# Patient Record
Sex: Female | Born: 1955 | Race: White | Hispanic: No | Smoking: Never smoker
Health system: Southern US, Community
[De-identification: ages and names within clinical notes are randomized; demographics above are authoritative.]

## PROBLEM LIST (undated history)

## (undated) DIAGNOSIS — G4700 Insomnia, unspecified: Secondary | ICD-10-CM

## (undated) DIAGNOSIS — I10 Essential (primary) hypertension: Secondary | ICD-10-CM

## (undated) DIAGNOSIS — F419 Anxiety disorder, unspecified: Secondary | ICD-10-CM

## (undated) DIAGNOSIS — F32A Depression, unspecified: Secondary | ICD-10-CM

## (undated) DIAGNOSIS — E785 Hyperlipidemia, unspecified: Secondary | ICD-10-CM

## (undated) DIAGNOSIS — T7840XA Allergy, unspecified, initial encounter: Secondary | ICD-10-CM

## (undated) HISTORY — DX: Depression, unspecified: F32.A

## (undated) HISTORY — DX: Anxiety disorder, unspecified: F41.9

## (undated) HISTORY — PX: HAND SURGERY: SHX662

## (undated) HISTORY — DX: Allergy, unspecified, initial encounter: T78.40XA

## (undated) HISTORY — DX: Insomnia, unspecified: G47.00

## (undated) HISTORY — DX: Essential (primary) hypertension: I10

## (undated) HISTORY — DX: Hyperlipidemia, unspecified: E78.5

## (undated) HISTORY — PX: ABDOMINAL HYSTERECTOMY: SHX81

---

## 1997-03-23 ENCOUNTER — Other Ambulatory Visit: Admission: RE | Admit: 1997-03-23 | Discharge: 1997-03-23 | Payer: Self-pay | Admitting: Obstetrics and Gynecology

## 1999-06-06 ENCOUNTER — Other Ambulatory Visit: Admission: RE | Admit: 1999-06-06 | Discharge: 1999-06-06 | Payer: Self-pay | Admitting: Obstetrics and Gynecology

## 2000-09-15 ENCOUNTER — Ambulatory Visit (HOSPITAL_COMMUNITY): Admission: RE | Admit: 2000-09-15 | Discharge: 2000-09-15 | Payer: Self-pay | Admitting: Obstetrics and Gynecology

## 2000-09-15 ENCOUNTER — Encounter: Payer: Self-pay | Admitting: Obstetrics and Gynecology

## 2000-09-17 ENCOUNTER — Other Ambulatory Visit: Admission: RE | Admit: 2000-09-17 | Discharge: 2000-09-17 | Payer: Self-pay | Admitting: Obstetrics and Gynecology

## 2000-09-25 ENCOUNTER — Encounter: Payer: Self-pay | Admitting: Obstetrics and Gynecology

## 2000-09-25 ENCOUNTER — Ambulatory Visit (HOSPITAL_COMMUNITY): Admission: RE | Admit: 2000-09-25 | Discharge: 2000-09-25 | Payer: Self-pay | Admitting: Obstetrics and Gynecology

## 2007-02-19 LAB — HM COLONOSCOPY

## 2010-03-08 DIAGNOSIS — I10 Essential (primary) hypertension: Secondary | ICD-10-CM | POA: Insufficient documentation

## 2010-03-08 DIAGNOSIS — M810 Age-related osteoporosis without current pathological fracture: Secondary | ICD-10-CM | POA: Insufficient documentation

## 2016-07-30 LAB — HM DEXA SCAN

## 2017-08-07 DIAGNOSIS — E785 Hyperlipidemia, unspecified: Secondary | ICD-10-CM | POA: Insufficient documentation

## 2017-08-07 DIAGNOSIS — A6000 Herpesviral infection of urogenital system, unspecified: Secondary | ICD-10-CM | POA: Insufficient documentation

## 2017-08-07 DIAGNOSIS — F4381 Prolonged grief disorder: Secondary | ICD-10-CM

## 2017-08-07 DIAGNOSIS — Z90711 Acquired absence of uterus with remaining cervical stump: Secondary | ICD-10-CM | POA: Insufficient documentation

## 2017-08-07 DIAGNOSIS — F419 Anxiety disorder, unspecified: Secondary | ICD-10-CM | POA: Insufficient documentation

## 2017-08-07 DIAGNOSIS — E782 Mixed hyperlipidemia: Secondary | ICD-10-CM | POA: Insufficient documentation

## 2017-08-07 DIAGNOSIS — F4329 Adjustment disorder with other symptoms: Secondary | ICD-10-CM | POA: Insufficient documentation

## 2017-08-07 DIAGNOSIS — J309 Allergic rhinitis, unspecified: Secondary | ICD-10-CM | POA: Insufficient documentation

## 2017-08-07 DIAGNOSIS — E042 Nontoxic multinodular goiter: Secondary | ICD-10-CM | POA: Insufficient documentation

## 2017-08-07 DIAGNOSIS — F4321 Adjustment disorder with depressed mood: Secondary | ICD-10-CM | POA: Insufficient documentation

## 2017-08-07 HISTORY — DX: Prolonged grief disorder: F43.81

## 2017-08-07 HISTORY — DX: Adjustment disorder with other symptoms: F43.29

## 2017-09-11 DIAGNOSIS — H9113 Presbycusis, bilateral: Secondary | ICD-10-CM | POA: Insufficient documentation

## 2018-08-03 DIAGNOSIS — Z0000 Encounter for general adult medical examination without abnormal findings: Secondary | ICD-10-CM | POA: Insufficient documentation

## 2018-08-03 LAB — HM PAP SMEAR: HM Pap smear: NORMAL

## 2018-12-17 ENCOUNTER — Encounter: Payer: Self-pay | Admitting: Internal Medicine

## 2018-12-21 ENCOUNTER — Other Ambulatory Visit: Payer: Self-pay

## 2018-12-21 ENCOUNTER — Encounter: Payer: Self-pay | Admitting: Internal Medicine

## 2018-12-21 ENCOUNTER — Ambulatory Visit (INDEPENDENT_AMBULATORY_CARE_PROVIDER_SITE_OTHER): Payer: Self-pay | Admitting: Internal Medicine

## 2018-12-21 VITALS — BP 128/82 | HR 79 | Temp 97.4°F | Ht 63.0 in | Wt 139.0 lb

## 2018-12-21 DIAGNOSIS — B009 Herpesviral infection, unspecified: Secondary | ICD-10-CM | POA: Insufficient documentation

## 2018-12-21 DIAGNOSIS — Z1231 Encounter for screening mammogram for malignant neoplasm of breast: Secondary | ICD-10-CM

## 2018-12-21 DIAGNOSIS — Z1211 Encounter for screening for malignant neoplasm of colon: Secondary | ICD-10-CM

## 2018-12-21 DIAGNOSIS — Z23 Encounter for immunization: Secondary | ICD-10-CM

## 2018-12-21 DIAGNOSIS — M810 Age-related osteoporosis without current pathological fracture: Secondary | ICD-10-CM

## 2018-12-21 DIAGNOSIS — I10 Essential (primary) hypertension: Secondary | ICD-10-CM

## 2018-12-21 MED ORDER — VALACYCLOVIR HCL 500 MG PO TABS: 1000.0000 mg | ORAL_TABLET | Freq: Two times a day (BID) | ORAL | 0 refills | Status: DC

## 2018-12-21 NOTE — Progress Notes (Signed)
Date:  12/21/2018   Name:  Brittney Morris   DOB:  [birth year]   MRN:  [id number]   Chief Complaint: Establish Care (Moved to [facility] in 1985. In 2007 moved to [facility] and now moved back to [place] this January 2020. Retired in January of this year. ), Questions (Should she take aspirin daily? Shingles vaccine? ), knee lump (X 3 month ago. Left knee lump moves around on knee. Not painful and has gotten smaller in size. ), and ArmPit pain (Has a random pain under left arm pit on and off. No discharge or breast pain otherwise. )  Hypertension This is a chronic problem. The problem is controlled. Pertinent negatives include no chest pain, headaches, palpitations or shortness of breath. Past treatments include beta blockers. The current treatment provides significant improvement.  Insomnia Primary symptoms: sleep disturbance.  The problem occurs nightly. The problem is unchanged. The symptoms are aggravated by anxiety (worse since husband died of cancer). Past treatments include medication (xanax 0.5 at bedtime most nights). The treatment provided significant relief.  Knee Pain  There was no injury mechanism. The pain is present in the left knee (mobile cystic structure lateral knee). The patient is experiencing no pain. The pain has been improving since onset.    Review of Systems  Constitutional: Negative for chills, fatigue, fever and unexpected weight change.  HENT: Negative for trouble swallowing.   Eyes: Negative for visual disturbance.  Respiratory: Negative for cough, chest tightness and shortness of breath.   Cardiovascular: Negative for chest pain, palpitations and leg swelling.  Gastrointestinal: Negative for abdominal pain, blood in stool, constipation and diarrhea.  Genitourinary: Negative for difficulty urinating.  Musculoskeletal: Negative for arthralgias and gait problem.  Skin: Negative for color change and rash.  Neurological: Negative for dizziness and headaches.   Psychiatric/Behavioral: Positive for sleep disturbance. Negative for hallucinations. The patient has insomnia. The patient is not nervous/anxious.     Patient Active Problem List   Diagnosis Date Noted  . Routine history and physical examination of adult 08/03/2018  . Presbycusis of both ears 09/11/2017  . Allergic rhinitis 08/07/2017  . Anxiety disorder 08/07/2017  . Genital herpes simplex 08/07/2017  . History of partial hysterectomy 08/07/2017  . Hyperlipidemia 08/07/2017  . Multiple thyroid nodules 08/07/2017  . Essential hypertension 03/08/2010  . Age-related osteoporosis without current pathological fracture 03/08/2010    Allergies  Allergen Reactions  . Fluconazole Rash  . Sulfa Antibiotics Hives  . Ciprofloxacin Nausea Only  . Lisinopril Cough    Past Surgical History:  Procedure Laterality Date  . ABDOMINAL HYSTERECTOMY     still has cervix and ovaries  . CESAREAN SECTION     X5  . HAND SURGERY      Social History   Tobacco Use  . Smoking status: Never Smoker  . Smokeless tobacco: Never Used  Substance Use Topics  . Alcohol use: Yes    Alcohol/week: 10.0 standard drinks    Types: 10 Standard drinks or equivalent per week  . Drug use: Never     Medication list has been reviewed and updated.  Current Meds  Medication Sig  . ALPRAZolam (XANAX) 0.5 MG tablet Take 0.5 mg by mouth 2 (two) times daily as needed for sleep.   . Calcium 600-400 MG-UNIT CHEW Chew by mouth.  . carvedilol (COREG) 12.5 MG tablet Take 12.5 mg by mouth 2 (two) times daily.  . valACYclovir (VALTREX) 500 MG tablet Take 2 tablets (1,000 mg total) by  mouth 2 (two) times daily.  Marland Kitchen VITAMIN D, CHOLECALCIFEROL, PO Take by mouth.  . [DISCONTINUED] valACYclovir (VALTREX) 1000 MG tablet Take 500 mg by mouth as needed.     PHQ 2/9 Scores 12/21/2018  PHQ - 2 Score 2  PHQ- 9 Score 6    BP Readings from Last 3 Encounters:  12/21/18 128/82    Physical Exam Vitals signs and nursing note  reviewed.  Constitutional:      General: She is not in acute distress.    Appearance: She is well-developed.  HENT:     Head: Normocephalic and atraumatic.  Cardiovascular:     Rate and Rhythm: Normal rate and regular rhythm.     Pulses: Normal pulses.     Heart sounds: No murmur.  Pulmonary:     Effort: Pulmonary effort is normal. No respiratory distress.     Breath sounds: No wheezing or rhonchi.  Abdominal:     General: Abdomen is flat.     Palpations: Abdomen is soft.     Tenderness: There is no abdominal tenderness. There is no right CVA tenderness or guarding.  Musculoskeletal: Normal range of motion.     Right lower leg: No edema.     Left lower leg: No edema.  Lymphadenopathy:     Cervical: No cervical adenopathy.     Upper Body:     Right upper body: No axillary adenopathy.     Left upper body: No axillary adenopathy.  Skin:    General: Skin is warm and dry.     Capillary Refill: Capillary refill takes less than 2 seconds.     Findings: No rash.  Neurological:     General: No focal deficit present.     Mental Status: She is alert and oriented to person, place, and time.  Psychiatric:        Behavior: Behavior normal.        Thought Content: Thought content normal.     Wt Readings from Last 3 Encounters:  12/21/18 139 lb (63 kg)    BP 128/82   Pulse 79   Temp (!) 97.4 F (36.3 C) (Temporal)   Ht 5\' 3"  (1.6 m)   Wt 139 lb (63 kg)   SpO2 99%   BMI 24.62 kg/m   Assessment and Plan: 1. Essential hypertension Clinically stable exam with well controlled BP.   Tolerating medications, coreg bid, without side effects at this time. Pt to continue current regimen and low sodium diet; benefits of regular exercise as able discussed. - CBC with Differential/Platelet - Comprehensive metabolic panel  2. Encounter for screening mammogram for breast cancer Due for mammogram Will schedule with DEXA - MM 3D SCREEN BREAST BILATERAL; Future  3. Colon cancer  screening Due for colonoscopy - Ambulatory referral to Gastroenterology  4. HSV-2 (herpes simplex virus 2) infection Use Valtrex PRN - valACYclovir (VALTREX) 500 MG tablet; Take 2 tablets (1,000 mg total) by mouth 2 (two) times daily.  Dispense: 30 tablet; Refill: 0  5. Age-related osteoporosis without current pathological fracture Continue Calcium and vitamin D Continue weight bearing exercise - DG Bone Density; Future  6. Need for shingles vaccine First dose today - Varicella-zoster vaccine IM   Partially dictated using . Any errors are unintentional.  Animal nutritionist, MD [facility]  12/21/2018

## 2018-12-22 ENCOUNTER — Telehealth: Payer: Self-pay

## 2018-12-22 ENCOUNTER — Other Ambulatory Visit: Payer: Self-pay

## 2018-12-22 DIAGNOSIS — Z1211 Encounter for screening for malignant neoplasm of colon: Secondary | ICD-10-CM

## 2018-12-22 LAB — CBC WITH DIFFERENTIAL/PLATELET
Basophils Absolute: 0.1 10*3/uL (ref 0.0–0.2)
Basos: 1 %
EOS (ABSOLUTE): 0.5 10*3/uL — ABNORMAL HIGH (ref 0.0–0.4)
Eos: 9 %
Hematocrit: 41.3 % (ref 34.0–46.6)
Hemoglobin: 14.2 g/dL (ref 11.1–15.9)
Immature Grans (Abs): 0 10*3/uL (ref 0.0–0.1)
Immature Granulocytes: 0 %
Lymphocytes Absolute: 1.7 10*3/uL (ref 0.7–3.1)
Lymphs: 30 %
MCH: 31.8 pg (ref 26.6–33.0)
MCHC: 34.4 g/dL (ref 31.5–35.7)
MCV: 92 fL (ref 79–97)
Monocytes Absolute: 0.5 10*3/uL (ref 0.1–0.9)
Monocytes: 8 %
Neutrophils Absolute: 2.9 10*3/uL (ref 1.4–7.0)
Neutrophils: 52 %
Platelets: 266 10*3/uL (ref 150–450)
RBC: 4.47 x10E6/uL (ref 3.77–5.28)
RDW: 12.6 % (ref 11.7–15.4)
WBC: 5.7 10*3/uL (ref 3.4–10.8)

## 2018-12-22 LAB — COMPREHENSIVE METABOLIC PANEL
ALT: 12 IU/L (ref 0–32)
AST: 15 IU/L (ref 0–40)
Albumin/Globulin Ratio: 2 (ref 1.2–2.2)
Albumin: 4.5 g/dL (ref 3.8–4.8)
Alkaline Phosphatase: 72 IU/L (ref 39–117)
BUN/Creatinine Ratio: 16 (ref 12–28)
BUN: 17 mg/dL (ref 8–27)
Bilirubin Total: 0.2 mg/dL (ref 0.0–1.2)
CO2: 24 mmol/L (ref 20–29)
Calcium: 9.5 mg/dL (ref 8.7–10.3)
Chloride: 105 mmol/L (ref 96–106)
Creatinine, Ser: 1.05 mg/dL — ABNORMAL HIGH (ref 0.57–1.00)
GFR calc Af Amer: 65 mL/min/{1.73_m2} (ref >59)
GFR calc non Af Amer: 57 mL/min/{1.73_m2} — ABNORMAL LOW (ref >59)
Globulin, Total: 2.2 g/dL (ref 1.5–4.5)
Glucose: 97 mg/dL (ref 65–99)
Potassium: 4.6 mmol/L (ref 3.5–5.2)
Sodium: 142 mmol/L (ref 134–144)
Total Protein: 6.7 g/dL (ref 6.0–8.5)

## 2018-12-22 NOTE — Telephone Encounter (Signed)
Gastroenterology Pre-Procedure Review  Request Date: Monday 01/25/19 Requesting Physician: Dr. Allen Norris  PATIENT REVIEW QUESTIONS: The patient responded to the following health history questions as indicated:    1. Are you having any GI issues? no 2. Do you have a personal history of Polyps? no 3. Do you have a family history of Colon Cancer or Polyps? no 4. Diabetes Mellitus? no 5. Joint replacements in the past 12 months?no 6. Major health problems in the past 3 months?no 7. Any artificial heart valves, MVP, or defibrillator?no    MEDICATIONS & ALLERGIES:    Patient reports the following regarding taking any anticoagulation/antiplatelet therapy:   Plavix, Coumadin, Eliquis, Xarelto, Lovenox, Pradaxa, Brilinta, or Effient? no Aspirin? no  Patient confirms/reports the following medications:  Current Outpatient Medications  Medication Sig Dispense Refill  . ALPRAZolam (XANAX) 0.5 MG tablet Take 0.5 mg by mouth 2 (two) times daily as needed for sleep.     . Calcium 600-400 MG-UNIT CHEW Chew by mouth.    . carvedilol (COREG) 12.5 MG tablet Take 12.5 mg by mouth 2 (two) times daily.    . valACYclovir (VALTREX) 500 MG tablet Take 2 tablets (1,000 mg total) by mouth 2 (two) times daily. 30 tablet 0  . VITAMIN D, CHOLECALCIFEROL, PO Take by mouth.     No current facility-administered medications for this visit.     Patient confirms/reports the following allergies:  Allergies  Allergen Reactions  . Fluconazole Rash  . Sulfa Antibiotics Hives  . Ciprofloxacin Nausea Only  . Lisinopril Cough    No orders of the defined types were placed in this encounter.   AUTHORIZATION INFORMATION Primary Insurance: 1D#: Group #:  Secondary Insurance: 1D#: Group #:  SCHEDULE INFORMATION: Date: 01/25/19 Time: Location:[facility]

## 2019-01-04 ENCOUNTER — Other Ambulatory Visit: Payer: Self-pay

## 2019-01-04 DIAGNOSIS — N644 Mastodynia: Secondary | ICD-10-CM

## 2019-01-05 ENCOUNTER — Other Ambulatory Visit: Payer: Self-pay | Admitting: *Deleted

## 2019-01-05 ENCOUNTER — Inpatient Hospital Stay
Admission: RE | Admit: 2019-01-05 | Discharge: 2019-01-05 | Disposition: A | Discharge status: Home / Self Care | Payer: Self-pay | Source: Ambulatory Visit | Attending: *Deleted | Admitting: *Deleted

## 2019-01-05 DIAGNOSIS — Z1231 Encounter for screening mammogram for malignant neoplasm of breast: Secondary | ICD-10-CM

## 2019-01-20 ENCOUNTER — Other Ambulatory Visit: Payer: Self-pay

## 2019-01-20 ENCOUNTER — Encounter: Payer: Self-pay | Admitting: *Deleted

## 2019-01-21 ENCOUNTER — Other Ambulatory Visit
Admission: RE | Admit: 2019-01-21 | Discharge: 2019-01-21 | Disposition: A | Discharge status: Home / Self Care | Payer: Commercial Managed Care - PPO | Source: Ambulatory Visit | Attending: Gastroenterology | Admitting: Gastroenterology

## 2019-01-21 DIAGNOSIS — Z20828 Contact with and (suspected) exposure to other viral communicable diseases: Secondary | ICD-10-CM | POA: Diagnosis not present

## 2019-01-21 DIAGNOSIS — Z01812 Encounter for preprocedural laboratory examination: Secondary | ICD-10-CM | POA: Diagnosis present

## 2019-01-22 LAB — SARS CORONAVIRUS 2 (TAT 6-24 HRS): SARS Coronavirus 2: NEGATIVE

## 2019-01-22 NOTE — Discharge Instructions (Signed)

## 2019-01-25 ENCOUNTER — Other Ambulatory Visit: Payer: Self-pay

## 2019-01-25 ENCOUNTER — Ambulatory Visit: Payer: Commercial Managed Care - PPO | Admitting: Anesthesiology

## 2019-01-25 ENCOUNTER — Ambulatory Visit
Admission: RE | Admit: 2019-01-25 | Discharge: 2019-01-25 | Disposition: A | Discharge status: Home / Self Care | Payer: Commercial Managed Care - PPO | Attending: Gastroenterology | Admitting: Gastroenterology

## 2019-01-25 ENCOUNTER — Encounter
Admission: RE | Disposition: A | Discharge status: Home / Self Care | Payer: Self-pay | Source: Home / Self Care | Attending: Gastroenterology

## 2019-01-25 DIAGNOSIS — Z820 Family history of epilepsy and other diseases of the nervous system: Secondary | ICD-10-CM | POA: Diagnosis not present

## 2019-01-25 DIAGNOSIS — Z818 Family history of other mental and behavioral disorders: Secondary | ICD-10-CM | POA: Insufficient documentation

## 2019-01-25 DIAGNOSIS — Z8249 Family history of ischemic heart disease and other diseases of the circulatory system: Secondary | ICD-10-CM | POA: Diagnosis not present

## 2019-01-25 DIAGNOSIS — Z1211 Encounter for screening for malignant neoplasm of colon: Secondary | ICD-10-CM | POA: Insufficient documentation

## 2019-01-25 DIAGNOSIS — Z8489 Family history of other specified conditions: Secondary | ICD-10-CM | POA: Insufficient documentation

## 2019-01-25 DIAGNOSIS — F419 Anxiety disorder, unspecified: Secondary | ICD-10-CM | POA: Insufficient documentation

## 2019-01-25 DIAGNOSIS — Z1210 Encounter for screening for malignant neoplasm of intestinal tract, unspecified: Secondary | ICD-10-CM | POA: Diagnosis not present

## 2019-01-25 DIAGNOSIS — K640 First degree hemorrhoids: Secondary | ICD-10-CM | POA: Diagnosis not present

## 2019-01-25 DIAGNOSIS — G4700 Insomnia, unspecified: Secondary | ICD-10-CM | POA: Insufficient documentation

## 2019-01-25 DIAGNOSIS — Z79899 Other long term (current) drug therapy: Secondary | ICD-10-CM | POA: Diagnosis not present

## 2019-01-25 DIAGNOSIS — I10 Essential (primary) hypertension: Secondary | ICD-10-CM | POA: Diagnosis not present

## 2019-01-25 DIAGNOSIS — Z882 Allergy status to sulfonamides status: Secondary | ICD-10-CM | POA: Diagnosis not present

## 2019-01-25 DIAGNOSIS — Z881 Allergy status to other antibiotic agents status: Secondary | ICD-10-CM | POA: Insufficient documentation

## 2019-01-25 DIAGNOSIS — Z888 Allergy status to other drugs, medicaments and biological substances status: Secondary | ICD-10-CM | POA: Diagnosis not present

## 2019-01-25 DIAGNOSIS — Z90710 Acquired absence of both cervix and uterus: Secondary | ICD-10-CM | POA: Insufficient documentation

## 2019-01-25 HISTORY — PX: COLONOSCOPY WITH PROPOFOL: SHX5780

## 2019-01-25 SURGERY — COLONOSCOPY WITH PROPOFOL
Anesthesia: General

## 2019-01-25 MED ORDER — PROPOFOL 10 MG/ML IV BOLUS
INTRAVENOUS | Status: DC | PRN
  Administered 2019-01-25: 40 mg via INTRAVENOUS
  Administered 2019-01-25: 200 mg via INTRAVENOUS

## 2019-01-25 MED ORDER — STERILE WATER FOR IRRIGATION IR SOLN
Status: DC | PRN
  Administered 2019-01-25: 50 mL

## 2019-01-25 MED ORDER — LACTATED RINGERS IV SOLN
100.0000 mL/h | INTRAVENOUS | Status: DC
  Administered 2019-01-25: 09:00:00 via INTRAVENOUS

## 2019-01-25 MED ORDER — LIDOCAINE HCL (CARDIAC) PF 100 MG/5ML IV SOSY
PREFILLED_SYRINGE | INTRAVENOUS | Status: DC | PRN
  Administered 2019-01-25: 30 mg via INTRAVENOUS

## 2019-01-25 SURGICAL SUPPLY — 24 items
CANISTER SUCT 1200ML W/VALVE (MISCELLANEOUS) ×3 IMPLANT
CLIP HMST 235XBRD CATH ROT (MISCELLANEOUS) IMPLANT
CLIP RESOLUTION 360 11X235 (MISCELLANEOUS)
ELECT REM PT RETURN 9FT ADLT (ELECTROSURGICAL)
ELECTRODE REM PT RTRN 9FT ADLT (ELECTROSURGICAL) IMPLANT
FCP ESCP3.2XJMB 240X2.8X (MISCELLANEOUS)
FORCEPS BIOP RAD 4 LRG CAP 4 (CUTTING FORCEPS) IMPLANT
FORCEPS BIOP RJ4 240 W/NDL (MISCELLANEOUS)
FORCEPS ESCP3.2XJMB 240X2.8X (MISCELLANEOUS) IMPLANT
GOWN CVR UNV OPN BCK APRN NK (MISCELLANEOUS) ×2 IMPLANT
GOWN ISOL THUMB LOOP REG UNIV (MISCELLANEOUS) ×6
INJECTOR VARIJECT VIN23 (MISCELLANEOUS) IMPLANT
KIT DEFENDO VALVE AND CONN (KITS) IMPLANT
KIT ENDO PROCEDURE OLY (KITS) ×3 IMPLANT
MARKER SPOT ENDO TATTOO 5ML (MISCELLANEOUS) IMPLANT
PROBE APC STR FIRE (PROBE) IMPLANT
RETRIEVER NET ROTH 2.5X230 LF (MISCELLANEOUS) IMPLANT
SNARE SHORT THROW 13M SML OVAL (MISCELLANEOUS) IMPLANT
SNARE SHORT THROW 30M LRG OVAL (MISCELLANEOUS) IMPLANT
SNARE SNG USE RND 15MM (INSTRUMENTS) IMPLANT
SPOT EX ENDOSCOPIC TATTOO (MISCELLANEOUS)
TRAP ETRAP POLY (MISCELLANEOUS) IMPLANT
VARIJECT INJECTOR VIN23 (MISCELLANEOUS)
WATER STERILE IRR 250ML POUR (IV SOLUTION) ×3 IMPLANT

## 2019-01-25 NOTE — Transfer of Care (Signed)
Immediate Anesthesia Transfer of Care Note  Patient: Brittney Morris  Procedure(s) Performed: COLONOSCOPY WITH PROPOFOL (N/A )  Patient Location: PACU  Anesthesia Type: General  Level of Consciousness: awake, alert  and patient cooperative  Airway and Oxygen Therapy: Patient Spontanous Breathing and Patient connected to supplemental oxygen  Post-op Assessment: Post-op Vital signs reviewed, Patient's Cardiovascular Status Stable, Respiratory Function Stable, Patent Airway and No signs of Nausea or vomiting  Post-op Vital Signs: Reviewed and stable  Complications: No apparent anesthesia complications

## 2019-01-25 NOTE — H&P (Signed)
[name], MD [facility] [address] [place], [place] [address] Phone: [phone] Fax : [phone]  Primary Care Physician:  Glean Hess, MD Primary Gastroenterologist:  Dr. Allen Norris  Pre-Procedure History & Physical: HPI:  Brittney Morris is a 63 y.o. female is here for a screening colonoscopy.   Past Medical History:  Diagnosis Date  . Allergy   . Anxiety   . 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 Hypertension   . Insomnia     Past Surgical History:  Procedure Laterality Date  . ABDOMINAL HYSTERECTOMY     still has cervix and ovaries  . CESAREAN SECTION     X5  . HAND SURGERY      Prior to Admission medications   Medication Sig Start Date End Date Taking? Authorizing Provider  ALPRAZolam Duanne Moron) 0.5 MG tablet Take 0.5 mg by mouth 2 (two) times daily as needed for sleep.  12/04/18  Yes [provider]  Calcium 600-400 MG-UNIT CHEW Chew by mouth. 09/11/17  Yes [provider]  carvedilol (COREG) 12.5 MG tablet Take 12.5 mg by mouth 2 (two) times daily. 11/18/18  Yes [provider]  polyethylene glycol (MIRALAX / GLYCOLAX) 17 g packet Take 17 g by mouth daily as needed.   Yes [provider]  valACYclovir (VALTREX) 500 MG tablet Take 2 tablets (1,000 mg total) by mouth 2 (two) times daily. 12/21/18  Yes Glean Hess, MD  VITAMIN D, CHOLECALCIFEROL, PO Take by mouth.   Yes [provider]    Allergies as of 12/22/2018 - Review Complete 12/21/2018  Allergen Reaction Noted  . Fluconazole Rash 12/18/2009  . Sulfa antibiotics Hives 12/18/2009  . Ciprofloxacin Nausea Only 07/24/2017  . Lisinopril Cough 01/14/2011    Family History  Problem Relation Age of Onset  . Heart disease Mother   . Heart attack Mother   . Muscular dystrophy Father   . Melanoma Daughter     Social History   Socioeconomic History  . Marital status: Widowed    Spouse name: Not on file  . Number of children: 5  .  Years of education: Not on file  . Highest education level: Not on file  Occupational History  . Not on file  Social Needs  . Financial resource strain: Not on file  . Food insecurity    Worry: Not on file    Inability: Not on file  . Transportation needs    Medical: Not on file    Non-medical: Not on file  Tobacco Use  . Smoking status: Never Smoker  . Smokeless tobacco: Never Used  Substance and Sexual Activity  . Alcohol use: Yes    Alcohol/week: 10.0 standard drinks    Types: 10 Standard drinks or equivalent per week  . Drug use: Never  . Sexual activity: Not Currently  Lifestyle  . Physical activity    Days per week: Not on file    Minutes per session: Not on file  . Stress: Not on file  Relationships  . Social Herbalist on phone: Not on file    Gets together: Not on file    Attends religious service: Not on file    Active member of club or organization: Not on file    Attends meetings of clubs or organizations: Not on file    Relationship status: Not on file  . Intimate partner violence    Fear of current or ex partner: Not on file  Emotionally abused: Not on file    Physically abused: Not on file    Forced sexual activity: Not on file  Other Topics Concern  . Not on file  Social History Narrative  . Not on file    Review of Systems: See HPI, otherwise negative ROS  Physical Exam: Ht 5\' 3"  (1.6 m)   Wt 62.6 kg   BMI 24.45 kg/m  General:   Alert,  pleasant and cooperative in NAD Head:  Normocephalic and atraumatic. Neck:  Supple; no masses or thyromegaly. Lungs:  Clear throughout to auscultation.    Heart:  Regular rate and rhythm. Abdomen:  Soft, nontender and nondistended. Normal bowel sounds, without guarding, and without rebound.   Neurologic:  Alert and  oriented x4;  grossly normal neurologically.  Impression/Plan: Brittney Morris is now here to undergo a screening colonoscopy.  Risks, benefits, and alternatives regarding  colonoscopy have been reviewed with the patient.  Questions have been answered.  All parties agreeable.

## 2019-01-25 NOTE — Op Note (Signed)
[facility] Gastroenterology Patient Name: Brittney Morris Procedure Date: 01/25/2019 9:13 AM MRN: [id number] Account #: [id number] Date of Birth: Mar 12, 1955 Admit Type: Outpatient Age: 63 Room: [facility] OR ROOM 01 Gender: Female Note Status: Finalized Procedure:             Colonoscopy Indications:           Screening for colorectal malignant neoplasm Providers:             [name] MD, MD Referring MD:          Halina Maidens, MD (Referring MD) Medicines:             Propofol per Anesthesia Complications:         No immediate complications. Procedure:             Pre-Anesthesia Assessment:                        - Prior to the procedure, a History and Physical was                         performed, and patient medications and allergies were                         reviewed. The patient's tolerance of previous                         anesthesia was also reviewed. The risks and benefits                         of the procedure and the sedation options and risks                         were discussed with the patient. All questions were                         answered, and informed consent was obtained. Prior                         Anticoagulants: The patient has taken no previous                         anticoagulant or antiplatelet agents. ASA Grade                         Assessment: II - A patient with mild systemic disease.                         After reviewing the risks and benefits, the patient                         was deemed in satisfactory condition to undergo the                         procedure.                        After obtaining informed consent, the colonoscope was  passed under direct vision. Throughout the procedure,                         the patient's blood pressure, pulse, and oxygen                         saturations were monitored continuously. The was                         introduced through the anus and  advanced to the the                         cecum, identified by appendiceal orifice and ileocecal                         valve. The colonoscopy was performed without                         difficulty. The patient tolerated the procedure well.                         The quality of the bowel preparation was excellent. Findings:      The perianal and digital rectal examinations were normal.      Non-bleeding internal hemorrhoids were found during retroflexion. The       hemorrhoids were Grade I (internal hemorrhoids that do not prolapse). Impression:            - Non-bleeding internal hemorrhoids.                        - No specimens collected. Recommendation:        - Discharge patient to home.                        - Resume previous diet.                        - Continue present medications. Procedure Code(s):     --- Professional ---                        [phone], Colonoscopy, flexible; diagnostic, including                         collection of specimen(s) by brushing or washing, when                         performed (separate procedure) Diagnosis Code(s):     --- Professional ---                        Z12.11, Encounter for screening for malignant neoplasm                         of colon CPT copyright 2019 American Medical Association. All rights reserved. The codes documented in this report are preliminary and upon coder review may  be revised to meet current compliance requirements. Midge Miniumarren Arwen Haseley MD, MD 01/25/2019 9:33:18 AM This report has been signed electronically. Number of Addenda: 0 Note Initiated On: 01/25/2019 9:13 AM Scope Withdrawal Time: 0 hours 6 minutes  32 seconds  Total Procedure Duration: 0 hours 9 minutes 54 seconds  Estimated Blood Loss:  Estimated blood loss: none.      [facility]

## 2019-01-25 NOTE — Anesthesia Procedure Notes (Signed)
Date/Time: 01/25/2019 9:18 AM Performed by: Cameron Ali, CRNA Pre-anesthesia Checklist: Patient identified, Emergency Drugs available, Suction available, Timeout performed and Patient being monitored Patient Re-evaluated:Patient Re-evaluated prior to induction Oxygen Delivery Method: Nasal cannula Placement Confirmation: positive ETCO2

## 2019-01-25 NOTE — Anesthesia Postprocedure Evaluation (Signed)
Anesthesia Post Note  Patient: Brittney Morris  Procedure(s) Performed: COLONOSCOPY WITH PROPOFOL (N/A )     Patient location during evaluation: PACU Anesthesia Type: General Level of consciousness: awake and alert Pain management: pain level controlled Vital Signs Assessment: post-procedure vital signs reviewed and stable Respiratory status: spontaneous breathing, nonlabored ventilation, respiratory function stable and patient connected to nasal cannula oxygen Cardiovascular status: blood pressure returned to baseline and stable Postop Assessment: no apparent nausea or vomiting Anesthetic complications: no    Adele Barthel Burhan Barham

## 2019-01-25 NOTE — Anesthesia Preprocedure Evaluation (Signed)
Anesthesia Evaluation  Patient identified by MRN, date of birth, ID band Patient awake    History of Anesthesia Complications Negative for: history of anesthetic complications  Airway Mallampati: II  TM Distance: >3 FB Neck ROM: Full    Dental no notable dental hx.    Pulmonary neg pulmonary ROS,    Pulmonary exam normal        Cardiovascular Exercise Tolerance: Good hypertension, Pt. on medications and Pt. on home beta blockers Normal cardiovascular exam     Neuro/Psych PSYCHIATRIC DISORDERS Anxiety negative neurological ROS     GI/Hepatic negative GI ROS, Neg liver ROS,   Endo/Other  negative endocrine ROS  Renal/GU negative Renal ROS     Musculoskeletal negative musculoskeletal ROS (+)   Abdominal   Peds  Hematology negative hematology ROS (+)   Anesthesia Other Findings   Reproductive/Obstetrics                             Anesthesia Physical Anesthesia Plan  ASA: II  Anesthesia Plan: General   Post-op Pain Management:    Induction: Intravenous  PONV Risk Score and Plan: 2 and Propofol infusion and TIVA  Airway Management Planned: Nasal Cannula and Natural Airway  Additional Equipment: None  Intra-op Plan:   Post-operative Plan:   Informed Consent: I have reviewed the patients History and Physical, chart, labs and discussed the procedure including the risks, benefits and alternatives for the proposed anesthesia with the patient or authorized representative who has indicated his/her understanding and acceptance.       Plan Discussed with: CRNA  Anesthesia Plan Comments:         Anesthesia Quick Evaluation

## 2019-01-26 ENCOUNTER — Encounter: Payer: Self-pay | Admitting: Gastroenterology

## 2019-01-28 ENCOUNTER — Ambulatory Visit
Admission: RE | Admit: 2019-01-28 | Discharge: 2019-01-28 | Disposition: A | Discharge status: Home / Self Care | Payer: Commercial Managed Care - PPO | Source: Ambulatory Visit | Attending: Internal Medicine | Admitting: Internal Medicine

## 2019-01-28 DIAGNOSIS — M810 Age-related osteoporosis without current pathological fracture: Secondary | ICD-10-CM | POA: Insufficient documentation

## 2019-01-28 DIAGNOSIS — N644 Mastodynia: Secondary | ICD-10-CM

## 2019-03-19 ENCOUNTER — Other Ambulatory Visit: Payer: Self-pay | Admitting: Internal Medicine

## 2019-03-19 ENCOUNTER — Telehealth: Payer: Self-pay

## 2019-03-19 DIAGNOSIS — Z23 Encounter for immunization: Secondary | ICD-10-CM

## 2019-03-19 MED ORDER — SHINGRIX 50 MCG/0.5ML IM SUSR: 0.5000 mL | Freq: Once | INTRAMUSCULAR | 0 refills | Status: AC

## 2019-03-19 NOTE — Telephone Encounter (Signed)
Patient called saying she has now moved to [facility]. She is scheduled for her 2nd Shingrix vaccine soon.   Wants a Rx faxed to her pharmacy in [place] so she can get her 2nd dose where she is located.  Need Rx printed and i'll fax for pt.   Thank you.

## 2019-03-19 NOTE — Telephone Encounter (Signed)
She may change. If she does, she will call to cancel her next appt. If not, she will come to her appt in May.

## 2019-03-19 NOTE — Telephone Encounter (Signed)
Is she also changing PCP?

## 2019-03-23 ENCOUNTER — Ambulatory Visit: Payer: Self-pay

## 2019-05-12 ENCOUNTER — Telehealth: Payer: Self-pay

## 2019-05-12 NOTE — Telephone Encounter (Signed)
Patient canceled her appt in February. She is supposed to get her 2nd shingles shot before 06/22/19.   Please call the patient and schedule appt for nurse visit before 06/22/19.  Thank you.

## 2019-05-12 NOTE — Telephone Encounter (Signed)
Thank you. Removed Dr B as the primary since pt moved. Added 2nd shingles shot.  CM

## 2019-05-12 NOTE — Telephone Encounter (Signed)
Pt got her 2nd [place] [facility] [place]

## 2019-06-20 ENCOUNTER — Other Ambulatory Visit: Payer: Self-pay | Admitting: Internal Medicine

## 2019-06-22 ENCOUNTER — Encounter: Payer: Self-pay | Admitting: Internal Medicine

## 2020-07-04 IMAGING — US US BREAST*L* LIMITED INC AXILLA
1 series · 2 of 2 positions shown · non-contrast
Comparison: Previous exam(s).

CLINICAL DATA: 63-year-old patient with focal area of tenderness in
the far lateral left breast intermittently. She does not palpate a
lump in either breast.

EXAM:
DIGITAL DIAGNOSTIC BILATERAL MAMMOGRAM WITH CAD AND TOMO
ULTRASOUND LEFT BREAST

[Series 1: us breast*left* limited inc axilla · 0.05mm/px · 2 of 2 slices shown]
[im 1/2]
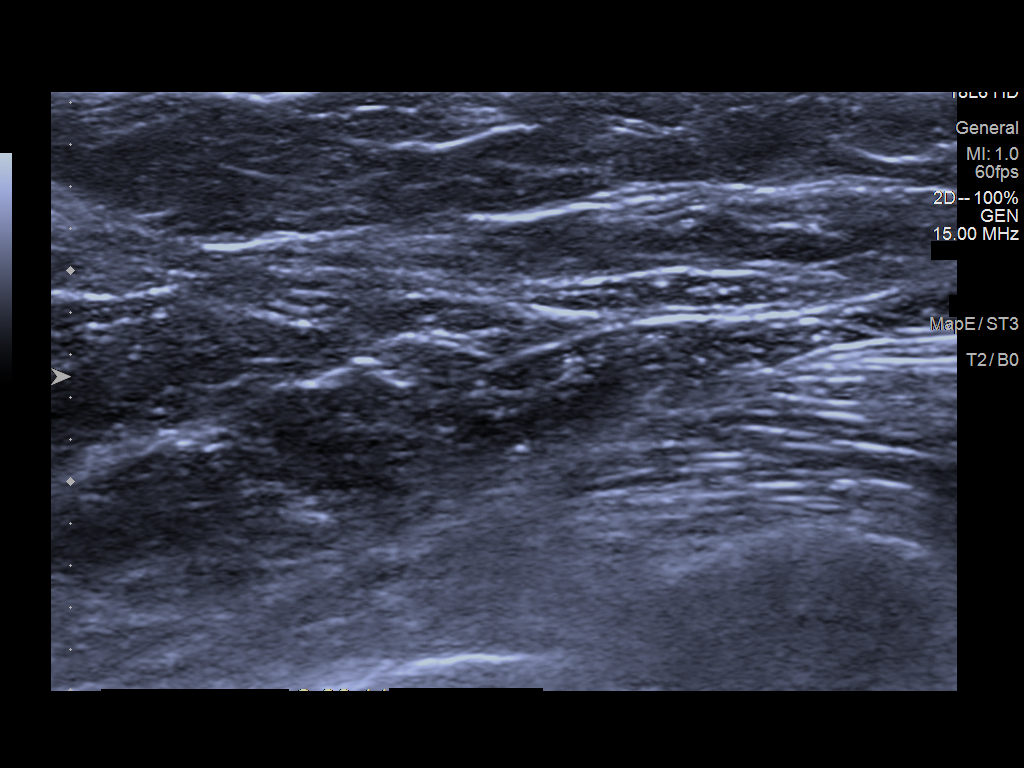
[im 2/2]
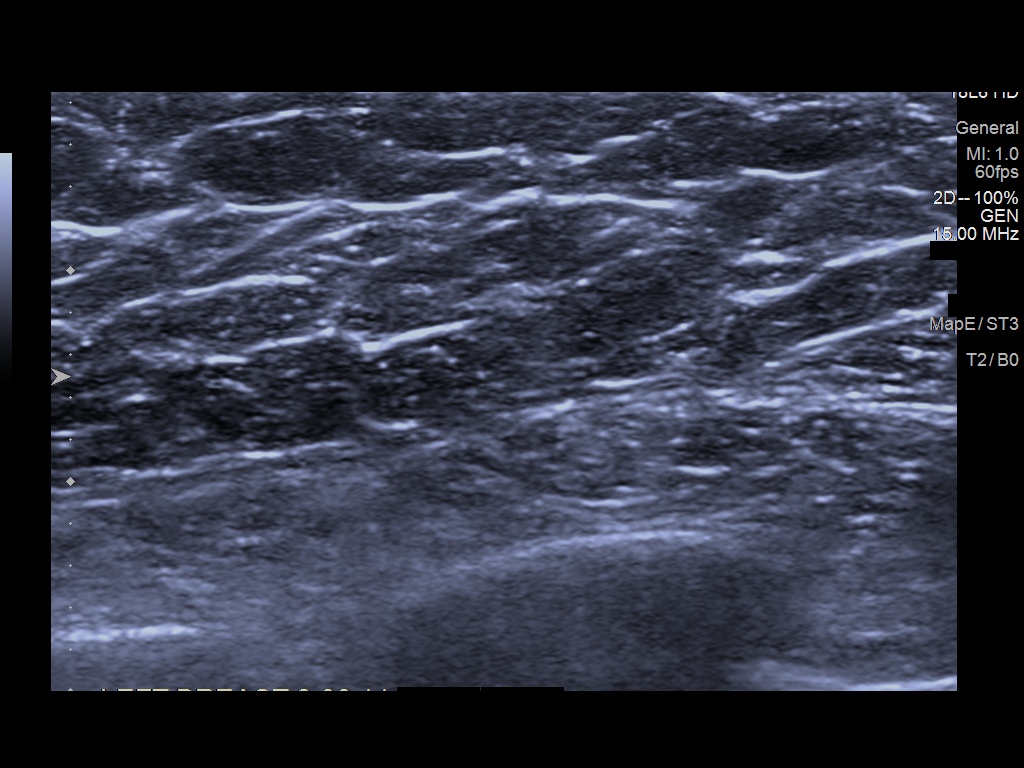

[2 of 2 positions shown; findings below may reference images not displayed]

ACR Breast Density Category b: There are scattered areas of
fibroglandular density.
FINDINGS: No mass, architectural distortion, or suspicious microcalcification
is identified to suggest malignancy in either breast.

Mammographic images were processed with CAD.

Targeted ultrasound is performed, showing normal fatty breast tissue
in the 3 o'clock position of the left breast approximately 11 cm
from the nipple in the region of patient tenderness. Normal skin
thickness. No suspicious findings.
IMPRESSION: No evidence of malignancy in either breast.

RECOMMENDATION:
Screening mammogram in one year.(Code:IV-M-V8M)

I have discussed the findings and recommendations with the patient.
If applicable, a reminder letter will be sent to the patient
regarding the next appointment.

BI-RADS CATEGORY  1: Negative.

## 2020-07-04 IMAGING — MG DIGITAL DIAGNOSTIC BILAT W/ TOMO W/ CAD
6 of 10 series · 6 of 30 positions shown · non-contrast
Comparison: Previous exam(s).

CLINICAL DATA: 63-year-old patient with focal area of tenderness in
the far lateral left breast intermittently. She does not palpate a
lump in either breast.

EXAM:
DIGITAL DIAGNOSTIC BILATERAL MAMMOGRAM WITH CAD AND TOMO
ULTRASOUND LEFT BREAST

[L MLO synth-2D]
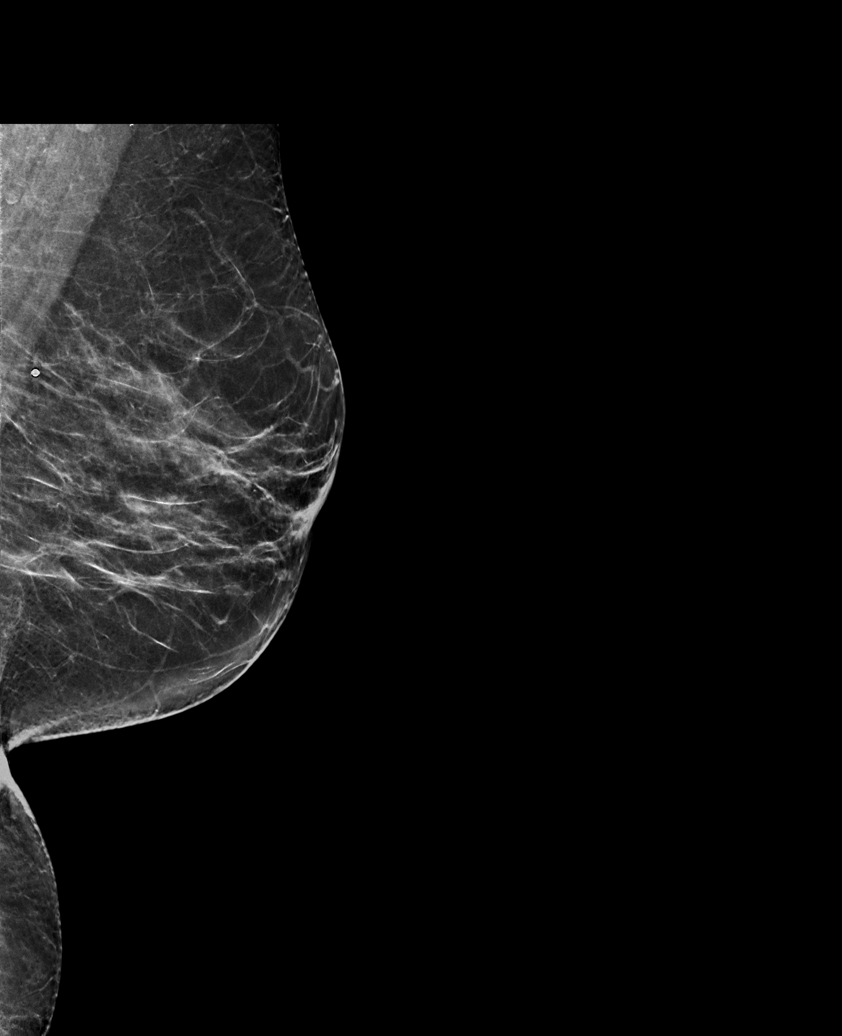

[R MLO synth-2D]
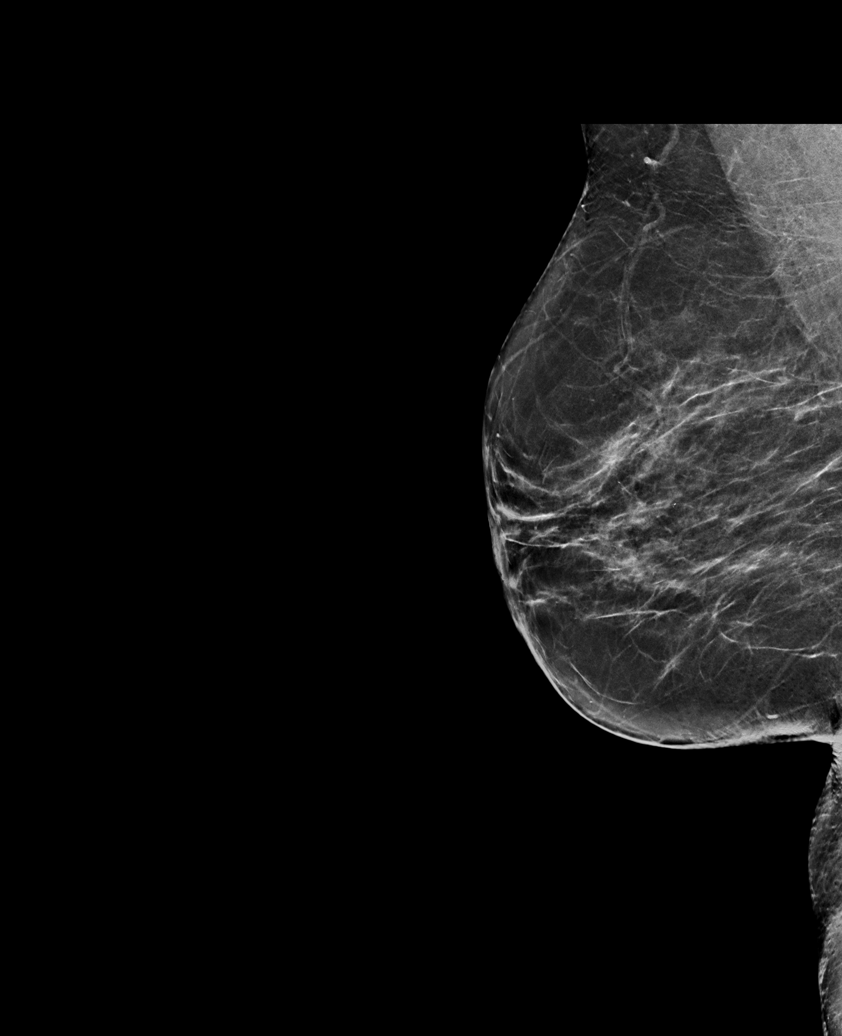

[L CC synth-2D]
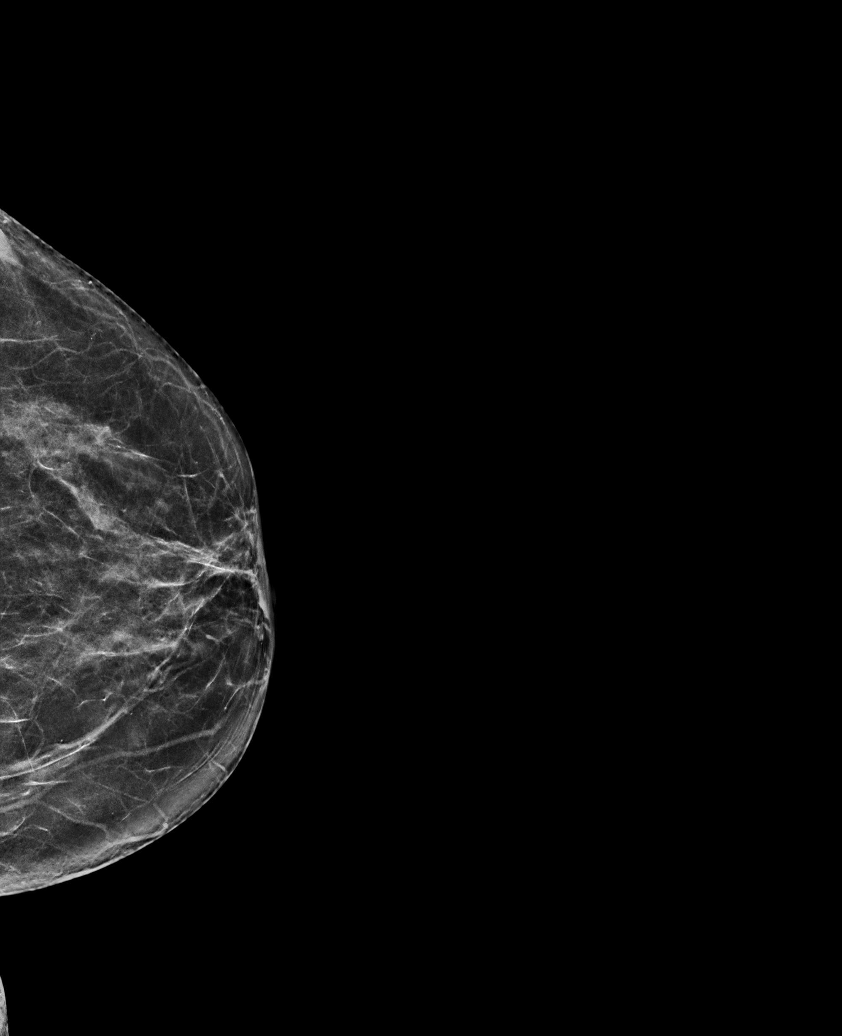

[L TAN synth-2D]
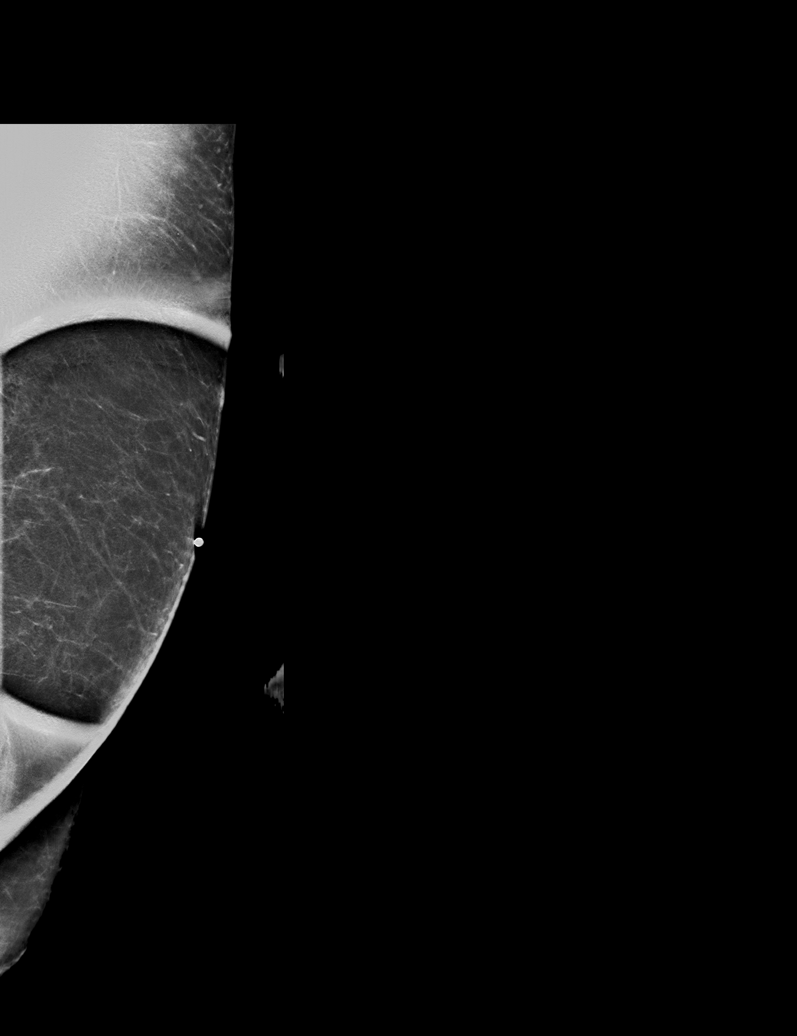

[R CC synth-2D]
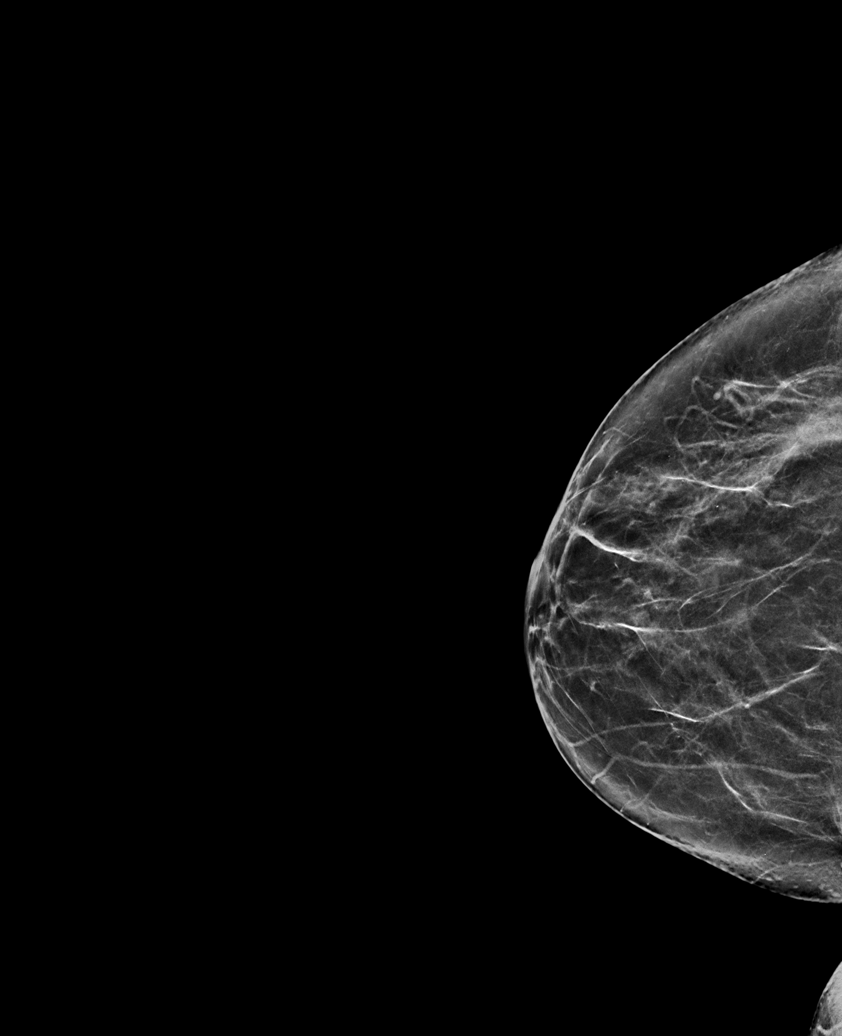

[L TAN tomo · tomo slice 29/57.0]
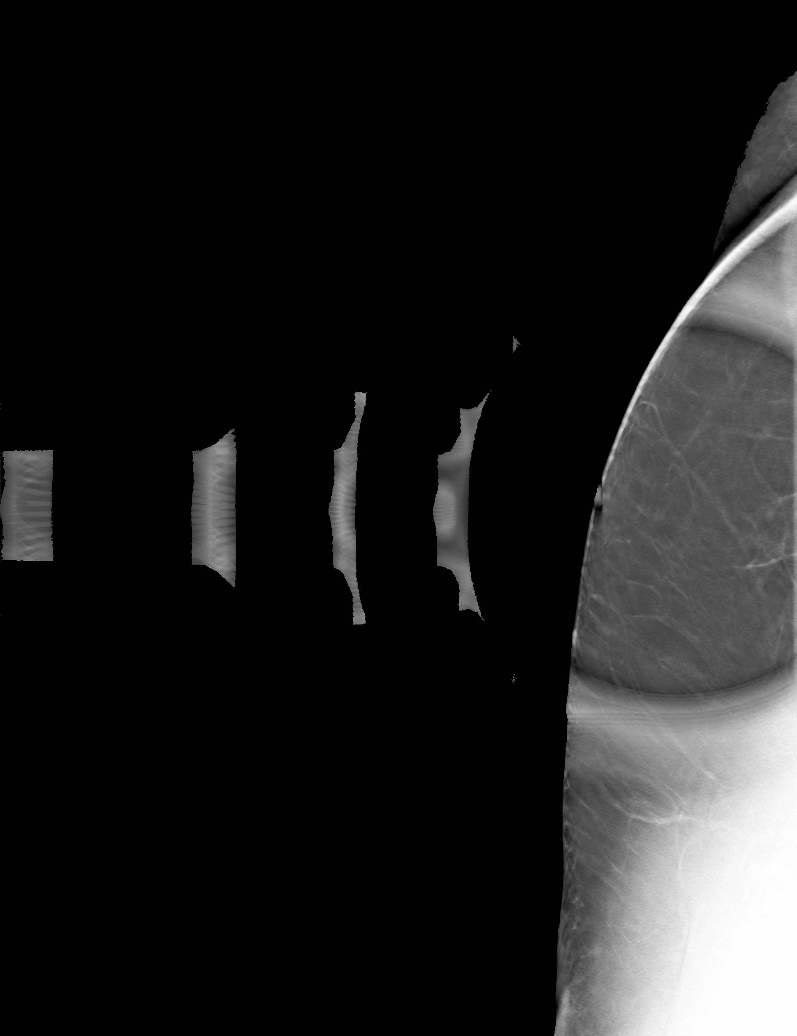

[6 of 30 positions shown; findings below may reference images not displayed]

ACR Breast Density Category b: There are scattered areas of
fibroglandular density.
FINDINGS: No mass, architectural distortion, or suspicious microcalcification
is identified to suggest malignancy in either breast.

Mammographic images were processed with CAD.

Targeted ultrasound is performed, showing normal fatty breast tissue
in the 3 o'clock position of the left breast approximately 11 cm
from the nipple in the region of patient tenderness. Normal skin
thickness. No suspicious findings.
IMPRESSION: No evidence of malignancy in either breast.

RECOMMENDATION:
Screening mammogram in one year.(Code:IV-M-V8M)

I have discussed the findings and recommendations with the patient.
If applicable, a reminder letter will be sent to the patient
regarding the next appointment.

BI-RADS CATEGORY  1: Negative.

## 2021-04-10 ENCOUNTER — Encounter: Payer: Medicare PPO | Admitting: Internal Medicine

## 2021-05-03 ENCOUNTER — Ambulatory Visit
Admission: EM | Admit: 2021-05-03 | Discharge: 2021-05-03 | Disposition: A | Discharge status: Home / Self Care | Payer: Medicare PPO | Attending: Emergency Medicine | Admitting: Emergency Medicine

## 2021-05-03 ENCOUNTER — Other Ambulatory Visit: Payer: Self-pay

## 2021-05-03 DIAGNOSIS — N390 Urinary tract infection, site not specified: Secondary | ICD-10-CM | POA: Diagnosis present

## 2021-05-03 DIAGNOSIS — J069 Acute upper respiratory infection, unspecified: Secondary | ICD-10-CM | POA: Diagnosis present

## 2021-05-03 LAB — URINALYSIS, ROUTINE W REFLEX MICROSCOPIC
Bilirubin Urine: NEGATIVE
Glucose, UA: NEGATIVE mg/dL
Ketones, ur: NEGATIVE mg/dL
Nitrite: NEGATIVE
Specific Gravity, Urine: <1.005 — ABNORMAL LOW (ref 1.005–1.030)
pH: 6 (ref 5.0–8.0)

## 2021-05-03 LAB — URINALYSIS, MICROSCOPIC (REFLEX): WBC, UA: >50 WBC/hpf (ref 0–5)

## 2021-05-03 LAB — RAPID INFLUENZA A&B ANTIGENS
Influenza A (ARMC): NEGATIVE
Influenza B (ARMC): NEGATIVE

## 2021-05-03 MED ORDER — BENZONATATE 100 MG PO CAPS: 200.0000 mg | ORAL_CAPSULE | Freq: Three times a day (TID) | ORAL | 0 refills | Status: DC

## 2021-05-03 MED ORDER — PHENAZOPYRIDINE HCL 200 MG PO TABS: 200.0000 mg | ORAL_TABLET | Freq: Three times a day (TID) | ORAL | 0 refills | Status: DC

## 2021-05-03 MED ORDER — PROMETHAZINE-DM 6.25-15 MG/5ML PO SYRP: 5.0000 mL | ORAL_SOLUTION | Freq: Four times a day (QID) | ORAL | 0 refills | Status: DC | PRN

## 2021-05-03 MED ORDER — CEPHALEXIN 500 MG PO CAPS: 500.0000 mg | ORAL_CAPSULE | Freq: Two times a day (BID) | ORAL | 0 refills | Status: AC

## 2021-05-03 MED ORDER — IPRATROPIUM BROMIDE 0.06 % NA SOLN: 2.0000 | Freq: Four times a day (QID) | NASAL | 12 refills | Status: DC

## 2021-05-03 NOTE — Discharge Instructions (Addendum)
Take the Keflex twice daily for 7 days with food for treatment of urinary tract infection. ° °Use the Pyridium every 8 hours as needed for urinary discomfort.  This will turn your urine a bright red-orange. ° °Increase your oral fluid intake so that you increase your urine production and or flushing your urinary system. ° °Take an over-the-counter probiotic, such as Culturelle-Align-Activia, 1 hour after each dose of antibiotic to prevent diarrhea or yeast infections from forming. ° °We will culture urine and change the antibiotics if necessary. ° °Use the Atrovent nasal spray, 2 squirts in each nostril every 6 hours, as needed for runny nose and postnasal drip. ° °Use the Tessalon Perles every 8 hours during the day.  Take them with a small sip of water.  They may give you some numbness to the base of your tongue or a metallic taste in your mouth, this is normal. ° °Use the Promethazine DM cough syrup at bedtime for cough and congestion.  It will make you drowsy so do not take it during the day. ° °Return for reevaluation, or see your primary care provider, for any new or worsening symptoms.  °

## 2021-05-03 NOTE — ED Triage Notes (Signed)
Patient presents to Urgent Care with complaints of chills, fever, cough since yesterday. Treating symptoms with cough drops, advil, robitussin, and Tylenol. Took tylenol at 0940. Tested positive for covid 02/15.  ?

## 2021-05-03 NOTE — ED Provider Notes (Signed)
?[facility] ? ? ? ?CSN: [id number] ?Arrival date & time: 05/03/21  1036 ? ? ?  ? ?History   ?Chief Complaint ?Chief Complaint  ?Patient presents with  ? Otalgia  ? Nasal Congestion  ? Dysuria  ? ? ?HPI ?Brittney Morris is a 66 y.o. female.  ? ?HPI ? ?Patient is a very pleasant, nontoxic-appearing 66 year old female here for evaluation of respiratory complaints. ? ?Patient reports that since yesterday she has been experiencing chills, fever with a Tmax of 100.1, and a nonproductive cough.  The cough increases when she has been talking.  She also has had some runny nose and nasal congestion and some head pressure.  Patient did have COVID back in February but those symptoms have completely resolved.  She denies any ear pain, shortness breath, or wheezing.  In addition, patient has been experiencing painful urination with urinary frequency for the past 2 days.  This is not associated with urgency of urination, blood in her urine, or low back pain.  Patient denies any vaginal discharge or itching. ? ?Past Medical History:  ?Diagnosis Date  ? Allergy   ? Anxiety   ? 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 Hypertension   ? Insomnia   ? ? ?Patient Active Problem List  ? Diagnosis Date Noted  ? Special screening for malignant neoplasm of intestine   ? 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 Routine history and physical examination of adult 08/03/2018  ? Presbycusis of both ears 09/11/2017  ? Allergic rhinitis 08/07/2017  ? Anxiety disorder 08/07/2017  ? Genital herpes simplex 08/07/2017  ? History of partial hysterectomy 08/07/2017  ? Hyperlipidemia 08/07/2017  ? Multiple thyroid nodules 08/07/2017  ? Essential hypertension 03/08/2010  ? Age-related osteoporosis without current pathological fracture 03/08/2010  ? ? ?Past Surgical History:  ?Procedure Laterality Date  ? ABDOMINAL HYSTERECTOMY    ? still has cervix and ovaries  ? CESAREAN SECTION    ? X5  ? COLONOSCOPY WITH PROPOFOL N/A  01/25/2019  ? Procedure: COLONOSCOPY WITH PROPOFOL;  Surgeon: [name], MD;  Location: [facility];  Service: Endoscopy;  Laterality: N/A;  ? HAND SURGERY    ? ? ?OB History   ?No obstetric history on file. ?  ? ? ? ?Home Medications   ? ?Prior to Admission medications   ?Medication Sig Start Date End Date Taking? Authorizing Provider  ?benzonatate (TESSALON) 100 MG capsule Take 2 capsules (200 mg total) by mouth every 8 (eight) hours. 05/03/21  Yes Margarette Canada, NP  ?cephALEXin (KEFLEX) 500 MG capsule Take 1 capsule (500 mg total) by mouth 2 (two) times daily for 7 days. 05/03/21 05/10/21 Yes Margarette Canada, NP  ?ipratropium (ATROVENT) 0.06 % nasal spray Place 2 sprays into both nostrils 4 (four) times daily. 05/03/21  Yes Margarette Canada, NP  ?phenazopyridine (PYRIDIUM) 200 MG tablet Take 1 tablet (200 mg total) by mouth 3 (three) times daily. 05/03/21  Yes Margarette Canada, NP  ?promethazine-dextromethorphan (PROMETHAZINE-DM) 6.25-15 MG/5ML syrup Take 5 mLs by mouth 4 (four) times daily as needed. 05/03/21  Yes Margarette Canada, NP  ?valACYclovir (VALTREX) 500 MG tablet Take by mouth. 02/15/21  Yes [provider]  ?ALPRAZolam Duanne Moron) 0.5 MG tablet Take 0.5 mg by mouth 2 (two) times daily as needed for sleep.  12/04/18   [provider]  ?Calcium 600-400 MG-UNIT CHEW Chew by mouth. 09/11/17   [provider]  ?carvedilol (COREG) 12.5 MG tablet Take 12.5 mg by mouth  2 (two) times daily. 11/18/18   [provider]  ?conjugated estrogens (PREMARIN) vaginal cream Premarin 0.625 mg/gram vaginal cream    [provider]  ?polyethylene glycol (MIRALAX / GLYCOLAX) 17 g packet Take 17 g by mouth daily as needed.    [provider]  ?valACYclovir (VALTREX) 500 MG tablet Take 2 tablets (1,000 mg total) by mouth 2 (two) times daily. 12/21/18   Reubin Milan, MD  ?vitamin B-12 (CYANOCOBALAMIN) 250 MCG tablet Take by mouth.    [provider]  ?VITAMIN D, CHOLECALCIFEROL,  PO Take by mouth.    [provider]  ? ? ?Family History ?Family History  ?Problem Relation Age of Onset  ? Heart disease Mother   ? Heart attack Mother   ? Muscular dystrophy Father   ? Melanoma Daughter   ? ? ?Social History ?Social History  ? ?Tobacco Use  ? Smoking status: Never  ? Smokeless tobacco: Never  ?Vaping Use  ? Vaping Use: Never used  ?Substance Use Topics  ? Alcohol use: Not Currently  ?  Alcohol/week: 10.0 standard drinks  ?  Types: 10 Standard drinks or equivalent per week  ? Drug use: Never  ? ? ? ?Allergies   ?Vancomycin, Fluconazole, Sulfa antibiotics, Ciprofloxacin, and Lisinopril ? ? ?Review of Systems ?Review of Systems  ?Constitutional:  Positive for chills and fever.  ?HENT:  Positive for congestion and rhinorrhea. Negative for ear pain and sore throat.   ?Respiratory:  Positive for cough. Negative for shortness of breath and wheezing.   ?Genitourinary:  Positive for dysuria and frequency. Negative for hematuria, urgency, vaginal discharge and vaginal pain.  ?Musculoskeletal:  Negative for back pain.  ?Skin:  Negative for rash.  ?Hematological: Negative.   ?Psychiatric/Behavioral: Negative.    ? ? ?Physical Exam ?Triage Vital Signs ?ED Triage Vitals  ?Enc Vitals Group  ?   BP 05/03/21 1147 129/78  ?   Pulse Rate 05/03/21 1147 79  ?   Resp --   ?   Temp 05/03/21 1147 99.2 ?F (37.3 ?C)  ?   Temp Source 05/03/21 1147 Oral  ?   SpO2 05/03/21 1147 96 %  ?   Weight --   ?   Height --   ?   Head Circumference --   ?   Peak Flow --   ?   Pain Score 05/03/21 1153 0  ?   Pain Loc --   ?   Pain Edu? --   ?   Excl. in GC? --   ? ?No data found. ? ?Updated Vital Signs ?BP 129/78 (BP Location: Right Arm)   Pulse 79   Temp 99.2 ?F (37.3 ?C) (Oral)   SpO2 96%  ? ?Visual Acuity ?Right Eye Distance:   ?Left Eye Distance:   ?Bilateral Distance:   ? ?Right Eye Near:   ?Left Eye Near:    ?Bilateral Near:    ? ?Physical Exam ?Vitals and nursing note reviewed.  ?Constitutional:   ?   Appearance:  Normal appearance. She is not ill-appearing.  ?HENT:  ?   Head: Normocephalic and atraumatic.  ?   Right Ear: Tympanic membrane, ear canal and external ear normal. There is no impacted cerumen.  ?   Left Ear: Tympanic membrane, ear canal and external ear normal. There is no impacted cerumen.  ?   Nose: Congestion and rhinorrhea present.  ?   Mouth/Throat:  ?   Mouth: Mucous membranes are moist.  ?  Pharynx: Oropharynx is clear. Posterior oropharyngeal erythema present.  ?Cardiovascular:  ?   Rate and Rhythm: Normal rate and regular rhythm.  ?   Pulses: Normal pulses.  ?   Heart sounds: Normal heart sounds. No murmur heard. ?  No friction rub. No gallop.  ?Pulmonary:  ?   Effort: Pulmonary effort is normal.  ?   Breath sounds: Normal breath sounds. No wheezing, rhonchi or rales.  ?Abdominal:  ?   General: Abdomen is flat.  ?   Palpations: Abdomen is soft.  ?   Tenderness: There is no abdominal tenderness. There is no right CVA tenderness or left CVA tenderness.  ?Musculoskeletal:  ?   Cervical back: Normal range of motion and neck supple.  ?Lymphadenopathy:  ?   Cervical: No cervical adenopathy.  ?Skin: ?   General: Skin is warm and dry.  ?   Capillary Refill: Capillary refill takes less than 2 seconds.  ?   Findings: No erythema or rash.  ?Neurological:  ?   General: No focal deficit present.  ?   Mental Status: She is alert and oriented to person, place, and time.  ?Psychiatric:     ?   Mood and Affect: Mood normal.     ?   Behavior: Behavior normal.     ?   Thought Content: Thought content normal.     ?   Judgment: Judgment normal.  ? ? ? ?UC Treatments / Results  ?Labs ?(all labs ordered are listed, but only abnormal results are displayed) ?Labs Reviewed  ?URINALYSIS, ROUTINE W REFLEX MICROSCOPIC - Abnormal; Notable for the following components:  ?    Result Value  ? APPearance HAZY (*)   ? Specific Gravity, Urine <1.005 (*)   ? Hgb urine dipstick TRACE (*)   ? Protein, ur TRACE (*)   ? Leukocytes,Ua MODERATE  (*)   ? All other components within normal limits  ?URINALYSIS, MICROSCOPIC (REFLEX) - Abnormal; Notable for the following components:  ? Bacteria, UA MANY (*)   ? All other components within normal limits

## 2021-05-03 NOTE — ED Triage Notes (Signed)
Patient presents to Urgent Care with complaints of dysuria since Tuesday. Concerned with UTI.  ?

## 2021-05-06 LAB — URINE CULTURE: Culture: >=100000 — AB

## 2021-06-11 ENCOUNTER — Ambulatory Visit
Admission: EM | Admit: 2021-06-11 | Discharge: 2021-06-11 | Disposition: A | Discharge status: Home / Self Care | Payer: Medicare PPO | Attending: Internal Medicine | Admitting: Internal Medicine

## 2021-06-11 DIAGNOSIS — J40 Bronchitis, not specified as acute or chronic: Secondary | ICD-10-CM | POA: Diagnosis not present

## 2021-06-11 DIAGNOSIS — J040 Acute laryngitis: Secondary | ICD-10-CM | POA: Diagnosis not present

## 2021-06-11 DIAGNOSIS — J0110 Acute frontal sinusitis, unspecified: Secondary | ICD-10-CM | POA: Diagnosis not present

## 2021-06-11 MED ORDER — CLARITHROMYCIN ER 500 MG PO TB24: 1000.0000 mg | ORAL_TABLET | Freq: Every day | ORAL | 0 refills | Status: DC

## 2021-06-11 MED ORDER — HYDROCODONE BIT-HOMATROP MBR 5-1.5 MG/5ML PO SOLN: 5.0000 mL | Freq: Every evening | ORAL | 0 refills | Status: DC | PRN

## 2021-06-11 NOTE — ED Triage Notes (Signed)
Pt c/o Cough with Green/brown phlegm, nasal congestion, nasal drainage x2weeks ?

## 2021-06-11 NOTE — ED Provider Notes (Signed)
?[facility] ? ? ? ?CSN: [id number] ?Arrival date & time: 06/11/21  1448 ? ? ?  ? ?History   ?Chief Complaint ?Chief Complaint  ?Patient presents with  ? Cough  ? Nasal Congestion  ? ? ?HPI ?Brittney Morris is a 66 y.o. female who presents with URI symptoms starting 2 weeks ago, now is coughing green mucous. It all seemed to start with what she thought it was allergies.  ? ? ? ?Past Medical History:  ?Diagnosis Date  ? Allergy   ? Anxiety   ? 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 Hypertension   ? Insomnia   ? ? ?Patient Active Problem List  ? Diagnosis Date Noted  ? Special screening for malignant neoplasm of intestine   ? 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 Routine history and physical examination of adult 08/03/2018  ? Presbycusis of both ears 09/11/2017  ? Allergic rhinitis 08/07/2017  ? Anxiety disorder 08/07/2017  ? Genital herpes simplex 08/07/2017  ? History of partial hysterectomy 08/07/2017  ? Hyperlipidemia 08/07/2017  ? Multiple thyroid nodules 08/07/2017  ? Essential hypertension 03/08/2010  ? Age-related osteoporosis without current pathological fracture 03/08/2010  ? ? ?Past Surgical History:  ?Procedure Laterality Date  ? ABDOMINAL HYSTERECTOMY    ? still has cervix and ovaries  ? CESAREAN SECTION    ? X5  ? COLONOSCOPY WITH PROPOFOL N/A 01/25/2019  ? Procedure: COLONOSCOPY WITH PROPOFOL;  Surgeon: Midge Minium, MD;  Location: [facility];  Service: Endoscopy;  Laterality: N/A;  ? HAND SURGERY    ? ? ?OB History   ?No obstetric history on file. ?  ? ? ? ?Home Medications   ? ?Prior to Admission medications   ?Medication Sig Start Date End Date Taking? Authorizing Provider  ?ALPRAZolam (XANAX) 0.5 MG tablet Take 0.5 mg by mouth 2 (two) times daily as needed for sleep.  12/04/18  Yes [provider]  ?Calcium 600-400 MG-UNIT CHEW Chew by mouth. 09/11/17  Yes [provider]  ?carvedilol (COREG) 12.5 MG tablet Take 12.5 mg by mouth 2  (two) times daily. 11/18/18  Yes [provider]  ?clarithromycin (BIAXIN XL) 500 MG 24 hr tablet Take 2 tablets (1,000 mg total) by mouth daily. 06/11/21  Yes Rodriguez-Southworth, Nettie Elm, PA-C  ?HYDROcodone bit-homatropine (HYCODAN) 5-1.5 MG/5ML syrup Take 5 mLs by mouth at bedtime as needed for cough. 06/11/21  Yes Rodriguez-Southworth, Nettie Elm, PA-C  ?ipratropium (ATROVENT) 0.06 % nasal spray Place 2 sprays into both nostrils 4 (four) times daily. 05/03/21  Yes Becky Augusta, NP  ?phenazopyridine (PYRIDIUM) 200 MG tablet Take 1 tablet (200 mg total) by mouth 3 (three) times daily. 05/03/21  Yes Becky Augusta, NP  ?polyethylene glycol (MIRALAX / GLYCOLAX) 17 g packet Take 17 g by mouth daily as needed.   Yes [provider]  ?valACYclovir (VALTREX) 500 MG tablet Take 2 tablets (1,000 mg total) by mouth 2 (two) times daily. 12/21/18  Yes Reubin Milan, MD  ?valACYclovir (VALTREX) 500 MG tablet Take by mouth. 02/15/21  Yes [provider]  ?vitamin B-12 (CYANOCOBALAMIN) 250 MCG tablet Take by mouth.   Yes [provider]  ?VITAMIN D, CHOLECALCIFEROL, PO Take by mouth.   Yes [provider]  ? ? ?Family History ?Family History  ?Problem Relation Age of Onset  ? Heart disease Mother   ? Heart attack Mother   ? Muscular dystrophy Father   ? Melanoma Daughter   ? ? ?Social History ?  Social History  ? ?Tobacco Use  ? Smoking status: Never  ? Smokeless tobacco: Never  ?Vaping Use  ? Vaping Use: Never used  ?Substance Use Topics  ? Alcohol use: Not Currently  ?  Alcohol/week: 10.0 standard drinks  ?  Types: 10 Standard drinks or equivalent per week  ? Drug use: Never  ? ? ? ?Allergies   ?Vancomycin, Fluconazole, Sulfa antibiotics, Ciprofloxacin, and Lisinopril ? ? ?Review of Systems ?Review of Systems  ?Constitutional:  Positive for appetite change, chills and fever. Negative for activity change and diaphoresis.  ?HENT:  Positive for congestion, postnasal drip and sneezing. Negative  for ear discharge, ear pain, rhinorrhea and sore throat.   ?Eyes:  Negative for discharge.  ?Respiratory:  Positive for cough. Negative for chest tightness, shortness of breath and wheezing.   ?Musculoskeletal:  Negative for myalgias.  ?Skin:  Negative for rash.  ?Neurological:  Negative for headaches.  ? ? ?Physical Exam ?Triage Vital Signs ?ED Triage Vitals  ?Enc Vitals Group  ?   BP 06/11/21 1517 117/81  ?   Pulse Rate 06/11/21 1517 68  ?   Resp 06/11/21 1517 18  ?   Temp 06/11/21 1517 98.2 ?F (36.8 ?C)  ?   Temp Source 06/11/21 1517 Oral  ?   SpO2 06/11/21 1517 98 %  ?   Weight --   ?   Height 06/11/21 1514 5\' 3"  (1.6 m)  ?   Head Circumference --   ?   Peak Flow --   ?   Pain Score 06/11/21 1514 0  ?   Pain Loc --   ?   Pain Edu? --   ?   Excl. in GC? --   ? ?No data found. ? ?Updated Vital Signs ?BP 117/81 (BP Location: Left Arm)   Pulse 68   Temp 98.2 ?F (36.8 ?C) (Oral)   Resp 18   Ht 5\' 3"  (1.6 m)   SpO2 98%   BMI 24.09 kg/m?  ? ?Visual Acuity ?Right Eye Distance:   ?Left Eye Distance:   ?Bilateral Distance:   ? ?Right Eye Near:   ?Left Eye Near:    ?Bilateral Near:    ? ?Physical Exam ?Vitals signs and nursing note reviewed.  ?Constitutional:  her voice is horsed ?   General: She is not in acute distress. ?   Appearance: Normal appearance. She is not ill-appearing, toxic-appearing or diaphoretic.  ?HENT:  ?   Head: Normocephalic.  ?   Right Ear: Tympanic membrane, ear canal and external ear normal.  ?   Left Ear: Tympanic membrane, ear canal and external ear normal.  ?   Nose: with mild congestion. Her frontal sinuses are tender.  ?   Mouth/Throat: clear ?   Mouth: Mucous membranes are moist.  ?Eyes:  ?   General: No scleral icterus.    ?   Right eye: No discharge.     ?   Left eye: No discharge.  ?   Conjunctiva/sclera: Conjunctivae normal.  ?Neck:  ?   Musculoskeletal: Neck supple. No neck rigidity.  ?Cardiovascular:  ?   Rate and Rhythm: Normal rate and regular rhythm.  ?   Heart sounds: No  murmur.  ?Pulmonary:  ?   Effort: Pulmonary effort is normal.  ?   Breath sounds: Normal breath sounds.  ?Musculoskeletal: Normal range of motion.  ?Lymphadenopathy:  ?   Cervical: No cervical adenopathy.  ?Skin: ?   General: Skin is warm and dry.  ?  Coloration: Skin is not jaundiced.  ?   Findings: No rash.  ?Neurological:  ?   Mental Status: She is alert and oriented to person, place, and time.  ?   Gait: Gait normal.  ?Psychiatric:     ?   Mood and Affect: Mood normal.     ?   Behavior: Behavior normal.     ?   Thought Content: Thought content normal.     ?   Judgment: Judgment normal.  ? ? ?UC Treatments / Results  ?Labs ?(all labs ordered are listed, but only abnormal results are displayed) ?Labs Reviewed - No data to display ? ?EKG ? ? ?Radiology ?No results found. ? ?Procedures ?Procedures (including critical care time) ? ?Medications Ordered in UC ?Medications - No data to display ? ?Initial Impression / Assessment and Plan / UC Course  ?I have reviewed the triage vital signs and the nursing notes. ?Sinusitis, bronchitis, laryngitis ?She was placed on Biaxen and hycodan.  ? ?Final Clinical Impressions(s) / UC Diagnoses  ? ?Final diagnoses:  ?Bronchitis  ?Acute frontal sinusitis, recurrence not specified  ?Laryngitis  ? ?Discharge Instructions   ?None ?  ? ?ED Prescriptions   ? ? Medication Sig Dispense Auth. Provider  ? HYDROcodone bit-homatropine (HYCODAN) 5-1.5 MG/5ML syrup Take 5 mLs by mouth at bedtime as needed for cough. 120 mL Rodriguez-Southworth, Nettie ElmSylvia, PA-C  ? clarithromycin (BIAXIN XL) 500 MG 24 hr tablet Take 2 tablets (1,000 mg total) by mouth daily. 7 tablet Rodriguez-Southworth, Nettie ElmSylvia, PA-C  ? ?  ? ?I have reviewed the PDMP during this encounter. ?  ?Garey HamRodriguez-Southworth, Teaghan Melrose, PA-C ?06/11/21 1601 ? ?

## 2021-06-13 ENCOUNTER — Telehealth: Payer: Self-pay | Admitting: Internal Medicine

## 2021-06-13 MED ORDER — CLARITHROMYCIN ER 500 MG PO TB24: 1000.0000 mg | ORAL_TABLET | Freq: Every day | ORAL | 0 refills | Status: DC

## 2021-06-13 NOTE — Telephone Encounter (Signed)
Current tablets of clarithromycin XL sent to the pharmacy.  Patient was prescribed 7 tablets instead of 14 at the last urgent care visit. ?

## 2021-08-01 ENCOUNTER — Encounter: Payer: Self-pay | Admitting: Internal Medicine

## 2021-08-01 ENCOUNTER — Other Ambulatory Visit: Payer: Self-pay | Admitting: Internal Medicine

## 2021-08-01 ENCOUNTER — Ambulatory Visit (INDEPENDENT_AMBULATORY_CARE_PROVIDER_SITE_OTHER): Payer: Medicare PPO | Admitting: Internal Medicine

## 2021-08-01 VITALS — BP 128/74 | HR 64 | Ht 63.0 in | Wt 134.0 lb

## 2021-08-01 DIAGNOSIS — Z1231 Encounter for screening mammogram for malignant neoplasm of breast: Secondary | ICD-10-CM

## 2021-08-01 DIAGNOSIS — F39 Unspecified mood [affective] disorder: Secondary | ICD-10-CM

## 2021-08-01 DIAGNOSIS — J3089 Other allergic rhinitis: Secondary | ICD-10-CM | POA: Diagnosis not present

## 2021-08-01 DIAGNOSIS — B009 Herpesviral infection, unspecified: Secondary | ICD-10-CM | POA: Diagnosis not present

## 2021-08-01 DIAGNOSIS — I10 Essential (primary) hypertension: Secondary | ICD-10-CM

## 2021-08-01 MED ORDER — VALACYCLOVIR HCL 500 MG PO TABS: 1000.0000 mg | ORAL_TABLET | Freq: Two times a day (BID) | ORAL | 0 refills | Status: DC

## 2021-08-01 NOTE — Progress Notes (Signed)
Date:  08/01/2021   Name:  Brittney Morris   DOB:  Jan 26, 1956   MRN:  [id number]   Chief Complaint: Establish Care (Cholesterol ) and Depression  Hypertension This is a chronic problem. The problem is controlled. Pertinent negatives include no chest pain, headaches, palpitations or shortness of breath. Past treatments include beta blockers. The current treatment provides significant improvement. There are no compliance problems.  There is no history of kidney disease, CAD/MI or CVA.    Lab Results  Component Value Date   NA 142 12/21/2018   K 4.6 12/21/2018   CO2 24 12/21/2018   GLUCOSE 97 12/21/2018   BUN 17 12/21/2018   CREATININE 1.05 (H) 12/21/2018   CALCIUM 9.5 12/21/2018   GFRNONAA 57 (L) 12/21/2018   No results found for: "CHOL", "HDL", "LDLCALC", "LDLDIRECT", "TRIG", "CHOLHDL" No results found for: "TSH" No results found for: "HGBA1C" Lab Results  Component Value Date   WBC 5.7 12/21/2018   HGB 14.2 12/21/2018   HCT 41.3 12/21/2018   MCV 92 12/21/2018   PLT 266 12/21/2018   Lab Results  Component Value Date   ALT 12 12/21/2018   AST 15 12/21/2018   ALKPHOS 72 12/21/2018   BILITOT 0.2 12/21/2018   No results found for: "25OHVITD2", "25OHVITD3", "VD25OH"   Review of Systems  Constitutional:  Negative for fatigue and unexpected weight change.  HENT:  Negative for nosebleeds.   Eyes:  Negative for visual disturbance.  Respiratory:  Negative for cough, chest tightness, shortness of breath and wheezing.   Cardiovascular:  Negative for chest pain, palpitations and leg swelling.  Gastrointestinal:  Negative for abdominal pain, constipation and diarrhea.  Genitourinary:  Negative for dysuria and urgency.  Musculoskeletal:  Positive for arthralgias (left shoulder).  Neurological:  Negative for dizziness, weakness, light-headedness and headaches.  Hematological:  Negative for adenopathy.  Psychiatric/Behavioral:  Positive for dysphoric mood (only when talking  about her mother's death) and sleep disturbance. The patient is not nervous/anxious.     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   Presbycusis of both ears 09/11/2017   Allergic rhinitis 08/07/2017   Anxiety disorder 08/07/2017   History of partial hysterectomy 08/07/2017   Hyperlipidemia 08/07/2017   Multiple thyroid nodules 08/07/2017   Essential hypertension 03/08/2010   Age-related osteoporosis without current pathological fracture 03/08/2010    Allergies  Allergen Reactions   Vancomycin Itching   Fluconazole Rash   Sulfa Antibiotics Hives   Ciprofloxacin Nausea Only   Lisinopril Cough    Past Surgical History:  Procedure Laterality Date   ABDOMINAL HYSTERECTOMY     still has cervix and ovaries   CESAREAN SECTION     X5   COLONOSCOPY WITH PROPOFOL N/A 01/25/2019   Procedure: COLONOSCOPY WITH PROPOFOL;  Surgeon: Midge MiniumWohl, Darren, MD;  [facility];  Service: Endoscopy;  Laterality: N/A;   HAND SURGERY      Social History   Tobacco Use   Smoking status: Never   Smokeless tobacco: Never  Vaping Use   Vaping Use: Never used  Substance Use Topics   Alcohol use: Not Currently    Alcohol/week: 10.0 standard drinks of alcohol    Types: 10 Standard drinks or equivalent per week   Drug use: Never     Medication list has been reviewed and updated.  Current Meds  Medication Sig  B Complex-C (B-COMPLEX WITH VITAMIN C) tablet Take 1 tablet by mouth daily.   Calcium 600-400 MG-UNIT CHEW Chew by mouth.   carvedilol (COREG) 12.5 MG tablet Take 12.5 mg by mouth 2 (two) times daily.   valACYclovir (VALTREX) 500 MG tablet Take 2 tablets (1,000 mg total) by mouth 2 (two) times daily. (Patient taking differently: Take 1,000 mg by mouth as needed.)   VITAMIN D, CHOLECALCIFEROL, PO Take by mouth. With K2       08/01/2021    3:00 PM  12/21/2018    2:15 PM  GAD 7 : Generalized Anxiety Score  Nervous, Anxious, on Edge 0 2  Control/stop worrying 0 0  Worry too much - different things 0 0  Trouble relaxing 0 0  Restless 0 0  Easily annoyed or irritable 0 0  Afraid - awful might happen 0 0  Total GAD 7 Score 0 2  Anxiety Difficulty Not difficult at all Not difficult at all       08/01/2021    2:59 PM  Depression screen PHQ 2/9  Decreased Interest 1  Down, Depressed, Hopeless 1  PHQ - 2 Score 2  Altered sleeping 1  Tired, decreased energy 2  Change in appetite 1  Feeling bad or failure about yourself  0  Trouble concentrating 0  Moving slowly or fidgety/restless 0  Suicidal thoughts 0  PHQ-9 Score 6  Difficult doing work/chores Not difficult at all    BP Readings from Last 3 Encounters:  08/01/21 128/74  06/11/21 117/81  05/03/21 129/78    Physical Exam Vitals and nursing note reviewed.  Constitutional:      General: She is not in acute distress.    Appearance: Normal appearance. She is well-developed.  HENT:     Head: Normocephalic and atraumatic.  Neck:     Vascular: No carotid bruit.  Cardiovascular:     Rate and Rhythm: Normal rate and regular rhythm.     Pulses: Normal pulses.  Pulmonary:     Effort: Pulmonary effort is normal. No respiratory distress.     Breath sounds: No wheezing or rhonchi.  Musculoskeletal:     Cervical back: Normal range of motion.     Right lower leg: No edema.     Left lower leg: No edema.  Lymphadenopathy:     Cervical: No cervical adenopathy.  Skin:    General: Skin is warm and dry.     Findings: No rash.  Neurological:     Mental Status: She is alert and oriented to person, place, and time.  Psychiatric:        Mood and Affect: Mood normal. Affect is tearful (but only when speaking about her mother).        Behavior: Behavior normal.     Wt Readings from Last 3 Encounters:  08/01/21 134 lb (60.8 kg)  01/25/19 136 lb (61.7 kg)  12/21/18 139 lb (63  kg)    BP 128/74   Pulse 64   Ht 5\' 3"  (1.6 m)   Wt 134 lb (60.8 kg)   SpO2 98%   BMI 23.74 kg/m   Assessment and Plan: 1. Essential hypertension Clinically stable exam with well controlled BP. Tolerating medications without side effects at this time. Pt to continue current regimen and low sodium diet; benefits of regular exercise as able discussed.  2. HSV-2 (herpes simplex virus 2) infection Continue Valtrex PRN - valACYclovir (VALTREX) 500 MG tablet; Take 2 tablets (1,000 mg total) by mouth 2 (  two) times daily.  Dispense: 30 tablet; Refill: 0  3. Mood disorder (HCC) Since moving back - living alone, friends are at the beach Not motivated to do much - Discussed volunteering, senior hikes, gym membership as options to get out of the house and meet people. Defer medication at this time due to normal grieving process  4. Environmental and seasonal allergies Dry cough persistent since Covid infection in February. Continue Zyrtec; follow up if worsening   5. Encounter for screening mammogram for breast cancer Due for annual screening - MM 3D SCREEN BREAST BILATERAL   Partially dictated using Animal nutritionist. Any errors are unintentional.  Bari Edward, MD [facility]  08/01/2021

## 2021-08-29 ENCOUNTER — Ambulatory Visit
Admission: RE | Admit: 2021-08-29 | Discharge: 2021-08-29 | Disposition: A | Discharge status: Home / Self Care | Payer: Medicare PPO | Source: Ambulatory Visit | Attending: Internal Medicine | Admitting: Internal Medicine

## 2021-08-29 DIAGNOSIS — Z1231 Encounter for screening mammogram for malignant neoplasm of breast: Secondary | ICD-10-CM | POA: Insufficient documentation

## 2021-08-30 ENCOUNTER — Inpatient Hospital Stay
Admission: RE | Admit: 2021-08-30 | Discharge: 2021-08-30 | Disposition: A | Discharge status: Home / Self Care | Payer: Self-pay | Source: Ambulatory Visit | Attending: *Deleted | Admitting: *Deleted

## 2021-08-30 ENCOUNTER — Other Ambulatory Visit: Payer: Self-pay | Admitting: Internal Medicine

## 2021-08-30 ENCOUNTER — Other Ambulatory Visit: Payer: Self-pay | Admitting: *Deleted

## 2021-08-30 DIAGNOSIS — Z1231 Encounter for screening mammogram for malignant neoplasm of breast: Secondary | ICD-10-CM

## 2021-08-30 MED ORDER — CARVEDILOL 12.5 MG PO TABS: 12.5000 mg | ORAL_TABLET | Freq: Two times a day (BID) | ORAL | 2 refills | Status: DC

## 2021-08-30 NOTE — Telephone Encounter (Signed)
Requested medication (s) are due for refill today:   Not sure  Requested medication (s) are on the active medication list:   Yes as a historical med  Future visit scheduled:   Yes   Last ordered: 11/18/2018 by historical provider reason returned.  Labs also due per protocol   Requested Prescriptions  Pending Prescriptions Disp Refills   carvedilol (COREG) 12.5 MG tablet      Sig: Take 1 tablet (12.5 mg total) by mouth 2 (two) times daily.     Cardiovascular: Beta Blockers 3 Failed - 08/30/2021 11:58 AM      Failed - Cr in normal range and within 360 days    Creatinine, Ser  Date Value Ref Range Status  12/21/2018 1.05 (H) 0.57 - 1.00 mg/dL Final         Failed - AST in normal range and within 360 days    AST  Date Value Ref Range Status  12/21/2018 15 0 - 40 IU/L Final         Failed - ALT in normal range and within 360 days    ALT  Date Value Ref Range Status  12/21/2018 12 0 - 32 IU/L Final         Passed - Last BP in normal range    BP Readings from Last 1 Encounters:  08/01/21 128/74         Passed - Last Heart Rate in normal range    Pulse Readings from Last 1 Encounters:  08/01/21 64         Passed - Valid encounter within last 6 months    Recent Outpatient Visits           4 weeks ago Essential hypertension   [facility] Reubin Milan, MD   2 years ago Essential hypertension   [facility] Reubin Milan, MD       Future Appointments             In 3 months Judithann Graves Nyoka Cowden, MD [facility], [facility]

## 2021-08-30 NOTE — Telephone Encounter (Signed)
Medication Refill - Medication: carvedilol 12.5  Has the patient contacted their pharmacy? Yes.   ((Agent: If yes, when and what did the pharmacy advise ?) Preferred Pharmacy (with phone number or street name): [facility] [place]  Has the patient been seen for an appointment in the last year OR does the patient have an upcoming appointment? yes  Agent: Please be advised that RX refills may take up to 3 business days. We ask that you follow-up with your pharmacy.

## 2021-11-25 ENCOUNTER — Other Ambulatory Visit: Payer: Self-pay | Admitting: Internal Medicine

## 2021-11-26 NOTE — Telephone Encounter (Signed)
Requested medication (s) are due for refill today: yes  Requested medication (s) are on the active medication list: yes  Last refill:  08/30/21 #60 2 RF  Future visit scheduled: yes  Notes to clinic:  overdue lab work   Requested Prescriptions  Pending Prescriptions Disp Refills   carvedilol (COREG) 12.5 MG tablet [Pharmacy Med Name: CARVEDILOL 12.5 MG TABLET] 180 tablet     Sig: TAKE 1 TABLET BY MOUTH 2 TIMES DAILY.     Cardiovascular: Beta Blockers 3 Failed - 11/25/2021  9:33 AM      Failed - Cr in normal range and within 360 days    Creatinine, Ser  Date Value Ref Range Status  12/21/2018 1.05 (H) 0.57 - 1.00 mg/dL Final         Failed - AST in normal range and within 360 days    AST  Date Value Ref Range Status  12/21/2018 15 0 - 40 IU/L Final         Failed - ALT in normal range and within 360 days    ALT  Date Value Ref Range Status  12/21/2018 12 0 - 32 IU/L Final         Passed - Last BP in normal range    BP Readings from Last 1 Encounters:  08/01/21 128/74         Passed - Last Heart Rate in normal range    Pulse Readings from Last 1 Encounters:  08/01/21 64         Passed - Valid encounter within last 6 months    Recent Outpatient Visits           3 months ago Essential hypertension   [facility], Jesse Sans, MD   2 years ago Essential hypertension   [facility], Jesse Sans, MD       Future Appointments             In 1 week Army Melia, Jesse Sans, MD [facility], [facility]

## 2021-12-05 ENCOUNTER — Ambulatory Visit (INDEPENDENT_AMBULATORY_CARE_PROVIDER_SITE_OTHER): Payer: Medicare PPO | Admitting: Internal Medicine

## 2021-12-05 ENCOUNTER — Encounter: Payer: Self-pay | Admitting: Internal Medicine

## 2021-12-05 VITALS — BP 128/72 | HR 75 | Ht 63.0 in | Wt 137.8 lb

## 2021-12-05 DIAGNOSIS — M810 Age-related osteoporosis without current pathological fracture: Secondary | ICD-10-CM

## 2021-12-05 DIAGNOSIS — I10 Essential (primary) hypertension: Secondary | ICD-10-CM | POA: Diagnosis not present

## 2021-12-05 DIAGNOSIS — Z1159 Encounter for screening for other viral diseases: Secondary | ICD-10-CM | POA: Diagnosis not present

## 2021-12-05 DIAGNOSIS — E042 Nontoxic multinodular goiter: Secondary | ICD-10-CM

## 2021-12-05 DIAGNOSIS — Z0000 Encounter for general adult medical examination without abnormal findings: Secondary | ICD-10-CM | POA: Diagnosis not present

## 2021-12-05 DIAGNOSIS — Z131 Encounter for screening for diabetes mellitus: Secondary | ICD-10-CM

## 2021-12-05 DIAGNOSIS — E782 Mixed hyperlipidemia: Secondary | ICD-10-CM

## 2021-12-05 LAB — POCT URINALYSIS DIPSTICK
Bilirubin, UA: NEGATIVE
Blood, UA: NEGATIVE
Glucose, UA: NEGATIVE
Ketones, UA: NEGATIVE
Nitrite, UA: NEGATIVE
Protein, UA: NEGATIVE
Spec Grav, UA: 1.01 (ref 1.010–1.025)
Urobilinogen, UA: 0.2 E.U./dL
pH, UA: 5 (ref 5.0–8.0)

## 2021-12-05 NOTE — Progress Notes (Signed)
Date:  12/05/2021   Name:  Brittney Morris   DOB:  10-Jan-1956   MRN:  [id number]   Chief Complaint: Annual Exam Brittney Morris is a 66 y.o. female who presents today for her Complete Annual Exam. She feels well. She reports exercising none. She reports she is sleeping fairly well. Breast complaints - none.  Mammogram: 08/2021 DEXA: 11/2018 osteoporosis Pap smear: discontinued Colonoscopy: 01/2019 repeat 10 yrs  Health Maintenance Due  Topic Date Due   Hepatitis C Screening  Never done    Immunization History  Administered Date(s) Administered   Influenza Split 12/20/2010   Moderna Sars-Covid-2 Vaccination 05/07/2019, 06/04/2019   Zoster Recombinat (Shingrix) 12/21/2018, 03/25/2019    Hypertension This is a chronic problem. The problem is controlled. Pertinent negatives include no chest pain, headaches, palpitations or shortness of breath. Past treatments include beta blockers. There is no history of kidney disease, CAD/MI or CVA.  Osteoporosis - last DEXA 2020.  Previously prescribed Boniva but she never took it.  Taking Calcium and Vitamin D.  Not exercising.   Lab Results  Component Value Date   NA 142 12/21/2018   K 4.6 12/21/2018   CO2 24 12/21/2018   GLUCOSE 97 12/21/2018   BUN 17 12/21/2018   CREATININE 1.05 (H) 12/21/2018   CALCIUM 9.5 12/21/2018   GFRNONAA 57 (L) 12/21/2018   No results found for: "CHOL", "HDL", "LDLCALC", "LDLDIRECT", "TRIG", "CHOLHDL" No results found for: "TSH" No results found for: "HGBA1C" Lab Results  Component Value Date   WBC 5.7 12/21/2018   HGB 14.2 12/21/2018   HCT 41.3 12/21/2018   MCV 92 12/21/2018   PLT 266 12/21/2018   Lab Results  Component Value Date   ALT 12 12/21/2018   AST 15 12/21/2018   ALKPHOS 72 12/21/2018   BILITOT 0.2 12/21/2018   No results found for: "25OHVITD2", "25OHVITD3", "VD25OH"   Review of Systems  Constitutional:  Negative for chills, fatigue and fever.  HENT:  Positive for hearing loss.  Negative for congestion, tinnitus, trouble swallowing and voice change.   Eyes:  Negative for visual disturbance.  Respiratory:  Negative for cough, chest tightness, shortness of breath and wheezing.   Cardiovascular:  Negative for chest pain, palpitations and leg swelling.  Gastrointestinal:  Negative for abdominal pain, constipation, diarrhea and vomiting.  Endocrine: Negative for polydipsia and polyuria.  Genitourinary:  Negative for dysuria, frequency, genital sores, vaginal bleeding and vaginal discharge.  Musculoskeletal:  Negative for arthralgias, gait problem and joint swelling.  Skin:  Negative for color change and rash.  Neurological:  Negative for dizziness, tremors, light-headedness and headaches.  Hematological:  Negative for adenopathy. Does not bruise/bleed easily.  Psychiatric/Behavioral:  Negative for dysphoric mood and sleep disturbance. The patient is not nervous/anxious.     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   Presbycusis of both ears 09/11/2017   Allergic rhinitis 08/07/2017   Anxiety disorder 08/07/2017   History of partial hysterectomy 08/07/2017   Mixed hyperlipidemia 08/07/2017   Multiple thyroid nodules 08/07/2017   Essential hypertension 03/08/2010   Age-related osteoporosis without current pathological fracture 03/08/2010    Allergies  Allergen Reactions   Vancomycin Itching   Fluconazole Rash   Sulfa Antibiotics Hives   Ciprofloxacin Nausea Only   Lisinopril Cough    Past Surgical History:  Procedure  Laterality Date   ABDOMINAL HYSTERECTOMY     still has cervix and ovaries   CESAREAN SECTION     X5   COLONOSCOPY WITH PROPOFOL N/A 01/25/2019   Procedure: COLONOSCOPY WITH PROPOFOL;  Surgeon: Midge Minium, MD;  Location: [facility];  Service: Endoscopy;   Laterality: N/A;   HAND SURGERY      Social History   Tobacco Use   Smoking status: Never   Smokeless tobacco: Never  Vaping Use   Vaping Use: Never used  Substance Use Topics   Alcohol use: Not Currently    Alcohol/week: 10.0 standard drinks of alcohol    Types: 10 Standard drinks or equivalent per week   Drug use: Never     Medication list has been reviewed and updated.  Current Meds  Medication Sig   B Complex-C (B-COMPLEX WITH VITAMIN C) tablet Take 1 tablet by mouth daily.   Calcium 600-400 MG-UNIT CHEW Chew by mouth.   carvedilol (COREG) 12.5 MG tablet TAKE 1 TABLET BY MOUTH 2 TIMES DAILY.   valACYclovir (VALTREX) 500 MG tablet Take 2 tablets (1,000 mg total) by mouth 2 (two) times daily.   VITAMIN D, CHOLECALCIFEROL, PO Take by mouth. With K2       12/05/2021    8:20 AM 08/01/2021    3:00 PM 12/21/2018    2:15 PM  GAD 7 : Generalized Anxiety Score  Nervous, Anxious, on Edge 2 0 2  Control/stop worrying 2 0 0  Worry too much - different things 2 0 0  Trouble relaxing 2 0 0  Restless 1 0 0  Easily annoyed or irritable 1 0 0  Afraid - awful might happen 1 0 0  Total GAD 7 Score 11 0 2  Anxiety Difficulty Somewhat difficult Not difficult at all Not difficult at all       12/05/2021    8:20 AM 08/01/2021    2:59 PM 12/21/2018    2:13 PM  Depression screen PHQ 2/9  Decreased Interest 1 1 1   Down, Depressed, Hopeless 1 1 1   PHQ - 2 Score 2 2 2   Altered sleeping 1 1 2    Tired, decreased energy 1 2 0  Change in appetite 1 1 2   Feeling bad or failure about yourself  1 0 0  Trouble concentrating 0 0 0  Moving slowly or fidgety/restless 0 0 0  Suicidal thoughts 0 0 0  PHQ-9 Score 6 6 6   Difficult doing work/chores Somewhat difficult Not difficult at all Not difficult at all     Significant value    BP Readings from Last 3 Encounters:  12/05/21 128/72  08/01/21 128/74  06/11/21 117/81    Physical Exam Vitals and nursing note reviewed.   Constitutional:      General: She is not in acute distress.    Appearance: She is well-developed.  HENT:     Head: Normocephalic and atraumatic.     Right Ear: Tympanic membrane and ear canal normal.     Left Ear: Tympanic membrane and ear canal normal.     Nose:     Right Sinus: No maxillary sinus tenderness.     Left Sinus: No maxillary sinus tenderness.  Eyes:     General: No scleral icterus.       Right eye: No discharge.        Left eye: No discharge.     Conjunctiva/sclera: Conjunctivae normal.  Neck:     Thyroid: No thyromegaly.  Vascular: No carotid bruit.  Cardiovascular:     Rate and Rhythm: Normal rate and regular rhythm.     Pulses: Normal pulses.     Heart sounds: Normal heart sounds.  Pulmonary:     Effort: Pulmonary effort is normal. No respiratory distress.     Breath sounds: No wheezing.  Chest:  Breasts:    Right: No mass, nipple discharge, skin change or tenderness.     Left: No mass, nipple discharge, skin change or tenderness.  Abdominal:     General: Bowel sounds are normal.     Palpations: Abdomen is soft.     Tenderness: There is no abdominal tenderness.  Musculoskeletal:     Cervical back: Normal range of motion. No erythema.     Right lower leg: No edema.     Left lower leg: No edema.  Lymphadenopathy:     Cervical: No cervical adenopathy.  Skin:    General: Skin is warm and dry.     Findings: No rash.  Neurological:     Mental Status: She is alert and oriented to person, place, and time.     Cranial Nerves: No cranial nerve deficit.     Sensory: No sensory deficit.     Deep Tendon Reflexes: Reflexes are normal and symmetric.  Psychiatric:        Attention and Perception: Attention normal.        Mood and Affect: Mood normal.     Wt Readings from Last 3 Encounters:  12/05/21 137 lb 12.8 oz (62.5 kg)  08/01/21 134 lb (60.8 kg)  01/25/19 136 lb (61.7 kg)    BP 128/72   Pulse 75   Ht 5\' 3"  (1.6 m)   Wt 137 lb 12.8 oz (62.5 kg)    SpO2 96%   BMI 24.41 kg/m   Assessment and Plan: 1. Annual physical exam Normal exam. Encourage regular exercise and activities outside the home.  2. Screening for diabetes mellitus - Hemoglobin A1c  3. Need for hepatitis C screening test - Hepatitis C antibody  4. Essential hypertension Clinically stable exam with well controlled BP. Tolerating medications without side effects at this time. Pt to continue current regimen and low sodium diet; benefits of regular exercise as able discussed. - CBC with Differential/Platelet - Comprehensive metabolic panel - POCT urinalysis dipstick  5. Multiple thyroid nodules No symptoms or nodules on exam. - TSH + free T4  6. Age-related osteoporosis without current pathological fracture Need to repeat DEXA and begin regular exercise Will advise if medications are needed - DG Bone Density  7. Mixed hyperlipidemia Check labs and advise - Lipid panel   Partially dictated using Dragon software. Any errors are unintentional.  , MD [facility]  12/05/2021

## 2021-12-05 NOTE — Patient Instructions (Addendum)
Call [facility] to schedule your Bone Density at [phone].  

## 2021-12-06 ENCOUNTER — Other Ambulatory Visit: Payer: Self-pay | Admitting: Internal Medicine

## 2021-12-06 DIAGNOSIS — E782 Mixed hyperlipidemia: Secondary | ICD-10-CM

## 2021-12-06 LAB — LIPID PANEL
Chol/HDL Ratio: 3.7 ratio (ref 0.0–4.4)
Cholesterol, Total: 229 mg/dL — ABNORMAL HIGH (ref 100–199)
HDL: 62 mg/dL (ref >39)
LDL Chol Calc (NIH): 145 mg/dL — ABNORMAL HIGH (ref 0–99)
Triglycerides: 127 mg/dL (ref 0–149)
VLDL Cholesterol Cal: 22 mg/dL (ref 5–40)

## 2021-12-06 LAB — COMPREHENSIVE METABOLIC PANEL
ALT: 14 IU/L (ref 0–32)
AST: 16 IU/L (ref 0–40)
Albumin/Globulin Ratio: 1.9 (ref 1.2–2.2)
Albumin: 4.3 g/dL (ref 3.9–4.9)
Alkaline Phosphatase: 89 IU/L (ref 44–121)
BUN/Creatinine Ratio: 14 (ref 12–28)
BUN: 14 mg/dL (ref 8–27)
Bilirubin Total: 0.4 mg/dL (ref 0.0–1.2)
CO2: 23 mmol/L (ref 20–29)
Calcium: 9.6 mg/dL (ref 8.7–10.3)
Chloride: 104 mmol/L (ref 96–106)
Creatinine, Ser: 0.99 mg/dL (ref 0.57–1.00)
Globulin, Total: 2.3 g/dL (ref 1.5–4.5)
Glucose: 111 mg/dL — ABNORMAL HIGH (ref 70–99)
Potassium: 4.3 mmol/L (ref 3.5–5.2)
Sodium: 142 mmol/L (ref 134–144)
Total Protein: 6.6 g/dL (ref 6.0–8.5)
eGFR: 63 mL/min/{1.73_m2} (ref >59)

## 2021-12-06 LAB — CBC WITH DIFFERENTIAL/PLATELET
Basophils Absolute: 0.1 10*3/uL (ref 0.0–0.2)
Basos: 1 %
EOS (ABSOLUTE): 0.5 10*3/uL — ABNORMAL HIGH (ref 0.0–0.4)
Eos: 7 %
Hematocrit: 47.3 % — ABNORMAL HIGH (ref 34.0–46.6)
Hemoglobin: 15.6 g/dL (ref 11.1–15.9)
Immature Grans (Abs): 0 10*3/uL (ref 0.0–0.1)
Immature Granulocytes: 0 %
Lymphocytes Absolute: 1.2 10*3/uL (ref 0.7–3.1)
Lymphs: 17 %
MCH: 31.6 pg (ref 26.6–33.0)
MCHC: 33 g/dL (ref 31.5–35.7)
MCV: 96 fL (ref 79–97)
Monocytes Absolute: 0.4 10*3/uL (ref 0.1–0.9)
Monocytes: 6 %
Neutrophils Absolute: 4.8 10*3/uL (ref 1.4–7.0)
Neutrophils: 69 %
Platelets: 257 10*3/uL (ref 150–450)
RBC: 4.93 x10E6/uL (ref 3.77–5.28)
RDW: 13.2 % (ref 11.7–15.4)
WBC: 7 10*3/uL (ref 3.4–10.8)

## 2021-12-06 LAB — TSH+FREE T4
Free T4: 1.46 ng/dL (ref 0.82–1.77)
TSH: 2.81 u[IU]/mL (ref 0.450–4.500)

## 2021-12-06 LAB — HEPATITIS C ANTIBODY: Hep C Virus Ab: NONREACTIVE

## 2021-12-06 LAB — HEMOGLOBIN A1C
Est. average glucose Bld gHb Est-mCnc: 108 mg/dL
Hgb A1c MFr Bld: 5.4 % (ref 4.8–5.6)

## 2021-12-06 MED ORDER — ATORVASTATIN CALCIUM 10 MG PO TABS: 10.0000 mg | ORAL_TABLET | Freq: Every day | ORAL | 1 refills | Status: DC

## 2021-12-07 NOTE — Progress Notes (Signed)
Pt has an appt for 05/2021.  KP

## 2021-12-12 ENCOUNTER — Ambulatory Visit
Admission: EM | Admit: 2021-12-12 | Discharge: 2021-12-12 | Disposition: A | Discharge status: Home / Self Care | Payer: Medicare PPO | Attending: Emergency Medicine | Admitting: Emergency Medicine

## 2021-12-12 DIAGNOSIS — J039 Acute tonsillitis, unspecified: Secondary | ICD-10-CM | POA: Diagnosis present

## 2021-12-12 LAB — GROUP A STREP BY PCR: Group A Strep by PCR: NOT DETECTED

## 2021-12-12 MED ORDER — AZITHROMYCIN 500 MG PO TABS: 500.0000 mg | ORAL_TABLET | Freq: Every day | ORAL | 0 refills | Status: DC

## 2021-12-12 NOTE — ED Triage Notes (Signed)
Patient c/o sore throat Brittney Morris  -- started about 4 days ago.

## 2021-12-12 NOTE — ED Provider Notes (Signed)
[facility]    CSN: [id number] Arrival date & time: 12/12/21  1254      History   Chief Complaint Chief Complaint  Patient presents with   Sore Throat    HPI Brittney Morris is a 66 y.o. female who presents with onset of L ST which feels swollen, HA and fatigue x 4 days. Denies rhinitis or cough. 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 Denies hx of fevers. She takes care of her 70 y/o granddaughter and she has not been sick.     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   Presbycusis of both ears 09/11/2017   Allergic rhinitis 08/07/2017   Anxiety disorder 08/07/2017   History of partial hysterectomy 08/07/2017   Mixed hyperlipidemia 08/07/2017   Multiple thyroid nodules 08/07/2017   Essential hypertension 03/08/2010   Age-related osteoporosis without current pathological fracture 03/08/2010    Past Surgical History:  Procedure Laterality Date   ABDOMINAL HYSTERECTOMY     still has cervix and ovaries   CESAREAN SECTION     X5   COLONOSCOPY WITH PROPOFOL N/A 01/25/2019   Procedure: COLONOSCOPY WITH PROPOFOL;  Surgeon: Midge Minium, MD;  Location: [facility];  Service: Endoscopy;  Laterality: N/A;   HAND SURGERY      OB History   No obstetric history on file.      Home Medications    Prior to Admission medications   Medication Sig Start Date End Date Taking? Authorizing Provider  azithromycin (ZITHROMAX) 500 MG tablet Take 1 tablet (500 mg total) by mouth daily. 12/12/21  Yes Rodriguez-Southworth, Nettie Elm, PA-C  atorvastatin (LIPITOR) 10 MG tablet  Take 1 tablet (10 mg total) by mouth daily. 12/06/21   Reubin Milan, MD  B Complex-C (B-COMPLEX WITH VITAMIN C) tablet Take 1 tablet by mouth daily.    [provider]  Calcium 600-400 MG-UNIT CHEW Chew by mouth. 09/11/17   [provider]  carvedilol (COREG) 12.5 MG tablet TAKE 1 TABLET BY MOUTH 2 TIMES DAILY. 11/26/21   Reubin Milan, MD  valACYclovir (VALTREX) 500 MG tablet Take 2 tablets (1,000 mg total) by mouth 2 (two) times daily. 08/01/21   Reubin Milan, MD  VITAMIN D, CHOLECALCIFEROL, PO Take by mouth. With K2    [provider]    Family History Family History  Problem Relation Age of Onset   Heart disease Mother    Heart attack Mother    Muscular dystrophy Father    Melanoma Daughter     Social History Social History   Tobacco Use   Smoking status: Never   Smokeless tobacco: Never  Vaping Use   Vaping Use: Never used  Substance Use Topics   Alcohol use: Not Currently    Alcohol/week: 10.0 standard drinks of alcohol    Types: 10 Standard drinks or equivalent per week   Drug use: Never     Allergies   Vancomycin, Fluconazole, Sulfa antibiotics, Ciprofloxacin, and Lisinopril   Review of Systems Review of Systems  Constitutional:  Positive for activity change, appetite change and fatigue. Negative for chills and fever.  HENT:  Positive for sore throat. Negative for congestion, postnasal drip and rhinorrhea.   Eyes:  Negative for discharge.  Respiratory:  Negative for cough.   Musculoskeletal:  Negative for myalgias.  Skin:  Negative for rash.  Neurological:  Positive for headaches.     Physical Exam Triage Vital Signs ED Triage Vitals  Enc Vitals Group     BP 12/12/21 1302 123/79     Pulse Rate 12/12/21 1302 74     Resp --      Temp 12/12/21 1302 98.1 F (36.7 C)     Temp Source 12/12/21 1302 Oral     SpO2 12/12/21 1302 98 %     Weight 12/12/21 1300 136 lb (61.7 kg)     Height 12/12/21 1300 5\' 3"  (1.6 m)      Head Circumference --      Peak Flow --      Pain Score 12/12/21 1259 6     Pain Loc --      Pain Edu? --      Excl. in GC? --    No data found.  Updated Vital Signs BP 123/79 (BP Location: Left Arm)   Pulse 74   Temp 98.1 F (36.7 C) (Oral)   Ht 5\' 3"  (1.6 m)   Wt 136 lb (61.7 kg)   SpO2 98%   BMI 24.09 kg/m   Visual Acuity Right Eye Distance:   Left Eye Distance:   Bilateral Distance:    Right Eye Near:   Left Eye Near:    Bilateral Near:     Physical Exam Vitals and nursing note reviewed.  Constitutional:      General: She is not in acute distress.    Appearance: She is normal weight. She is not toxic-appearing.  HENT:     Right Ear: External ear normal.     Left Ear: External ear normal.     Nose: No congestion.     Mouth/Throat:     Mouth: No oral lesions.     Pharynx: Uvula midline. Pharyngeal swelling and posterior oropharyngeal erythema present. No oropharyngeal exudate or uvula swelling.     Tonsils: No tonsillar exudate. 1+ on the right. 2+ on the left.  Eyes:     General: No scleral icterus.    Conjunctiva/sclera: Conjunctivae normal.  Neck:     Comments: On the L Pulmonary:     Effort: Pulmonary effort is normal.  Musculoskeletal:        General: Normal range of motion.     Cervical back: Neck supple.  Lymphadenopathy:     Cervical: Cervical adenopathy present.  Skin:    General: Skin is warm and dry.     Findings: No rash.  Neurological:     General: No focal deficit present.     Mental Status: She is alert and oriented to person, place, and time.     Gait: Gait normal.  Psychiatric:        Mood and Affect: Mood normal.        Behavior: Behavior normal.        Thought Content: Thought content normal.        Judgment: Judgment normal.      UC Treatments / Results  Labs (all labs ordered are listed, but only abnormal results are displayed) Labs Reviewed  GROUP A STREP BY PCR  PCR strep A is negative  EKG  Radiology No  results found.  Procedures Procedures (including critical care time)  Medications Ordered in UC Medications - No data to display  Initial Impression / Assessment and Plan / UC Course  I have reviewed the triage vital signs and the nursing notes.  Pertinent labs results that were available during my care of the patient were reviewed by me and considered in my medical decision making (see chart for details).  Tonsillitis  I placed her on Azithromycin to cover other strands of strep and mycoplasma   Final Clinical Impressions(s) / UC Diagnoses  Tonsillitis Final diagnoses:  Acute tonsillitis, unspecified etiology   Discharge Instructions   None    ED Prescriptions     Medication Sig Dispense Auth. Provider   azithromycin (ZITHROMAX) 500 MG tablet Take 1 tablet (500 mg total) by mouth daily. 5 tablet Rodriguez-Southworth, Sunday Spillers, PA-C      PDMP not reviewed this encounter.   Shelby Mattocks, PA-C 12/12/21 1406

## 2021-12-20 ENCOUNTER — Other Ambulatory Visit: Payer: Self-pay | Admitting: Internal Medicine

## 2021-12-20 DIAGNOSIS — B009 Herpesviral infection, unspecified: Secondary | ICD-10-CM

## 2021-12-20 NOTE — Telephone Encounter (Signed)
Unable to refill per protocol, Rx request is too soon. Last refill 11/26/21 for 60 and 1 refill. Should have enough until end of November. Will refuse.  Requested Prescriptions  Pending Prescriptions Disp Refills   carvedilol (COREG) 12.5 MG tablet [Pharmacy Med Name: CARVEDILOL 12.5 MG TABLET] 180 tablet 1    Sig: TAKE 1 TABLET BY MOUTH TWICE A DAY     Cardiovascular: Beta Blockers 3 Passed - 12/20/2021  1:30 PM      Passed - Cr in normal range and within 360 days    Creatinine, Ser  Date Value Ref Range Status  12/05/2021 0.99 0.57 - 1.00 mg/dL Final         Passed - AST in normal range and within 360 days    AST  Date Value Ref Range Status  12/05/2021 16 0 - 40 IU/L Final         Passed - ALT in normal range and within 360 days    ALT  Date Value Ref Range Status  12/05/2021 14 0 - 32 IU/L Final         Passed - Last BP in normal range    BP Readings from Last 1 Encounters:  12/12/21 123/79         Passed - Last Heart Rate in normal range    Pulse Readings from Last 1 Encounters:  12/12/21 74         Passed - Valid encounter within last 6 months    Recent Outpatient Visits           2 weeks ago Annual physical exam   [place] [facility], Jesse Sans, MD   4 months ago Essential hypertension   [place] [facility] at [facility], Jesse Sans, MD   3 years ago Essential hypertension   [place] [facility] at [facility], Jesse Sans, MD       Future Appointments             In 5 months Army Melia, Jesse Sans, MD [facility] Primary [facility] and [facility] at [facility], [facility]   In 11 months Army Melia, Jesse Sans, MD [place] [facility] at [facility], [facility]

## 2021-12-28 MED ORDER — VALACYCLOVIR HCL 500 MG PO TABS: 1000.0000 mg | ORAL_TABLET | Freq: Two times a day (BID) | ORAL | 1 refills | Status: AC

## 2021-12-28 MED ORDER — CARVEDILOL 12.5 MG PO TABS: 12.5000 mg | ORAL_TABLET | Freq: Two times a day (BID) | ORAL | 1 refills | Status: DC

## 2021-12-28 NOTE — Telephone Encounter (Signed)
Requested Prescriptions  Pending Prescriptions Disp Refills   valACYclovir (VALTREX) 500 MG tablet 360 tablet 1    Sig: Take 2 tablets (1,000 mg total) by mouth 2 (two) times daily.     Antimicrobials:  Antiviral Agents - Anti-Herpetic Passed - 12/28/2021 12:17 PM      Passed - Valid encounter within last 12 months    Recent Outpatient Visits           3 weeks ago Annual physical exam   [place] [facility] at [facility], Nyoka Cowden, MD   4 months ago Essential hypertension   [place] [facility] at [facility], Nyoka Cowden, MD   3 years ago Essential hypertension   [place] [facility] at [facility], Nyoka Cowden, MD       Future Appointments             In 5 months Judithann Graves Nyoka Cowden, MD [facility] Primary Care and [facility] Medicine at [facility], [facility]   In 11 months Reubin Milan, MD [facility] Primary Care and Sports Medicine at [facility], [facility]             carvedilol (COREG) 12.5 MG tablet 180 tablet 1    Sig: Take 1 tablet (12.5 mg total) by mouth 2 (two) times daily.     Cardiovascular: Beta Blockers 3 Passed - 12/28/2021 12:17 PM      Passed - Cr in normal range and within 360 days    Creatinine, Ser  Date Value Ref Range Status  12/05/2021 0.99 0.57 - 1.00 mg/dL Final         Passed - AST in normal range and within 360 days    AST  Date Value Ref Range Status  12/05/2021 16 0 - 40 IU/L Final         Passed - ALT in normal range and within 360 days    ALT  Date Value Ref Range Status  12/05/2021 14 0 - 32 IU/L Final         Passed - Last BP in normal range    BP Readings from Last 1 Encounters:  12/12/21 123/79         Passed - Last Heart Rate in normal range    Pulse Readings from Last 1 Encounters:  12/12/21 74         Passed - Valid encounter within last 6 months    Recent Outpatient Visits           3 weeks ago Annual  physical exam   [place] [facility] at [facility], Nyoka Cowden, MD   4 months ago Essential hypertension   [place] [facility] at [facility], Nyoka Cowden, MD   3 years ago Essential hypertension    Primary Care and Sports Medicine at [facility], Nyoka Cowden, MD       Future Appointments             In 5 months Judithann Graves Nyoka Cowden, MD [facility] Primary Care and Sports Medicine at [facility], [facility]   In 11 months Reubin Milan, MD [facility] and [facility] Medicine at [facility], [facility]            Refused Prescriptions Disp Refills   carvedilol (COREG) 12.5 MG tablet [Pharmacy Med Name: CARVEDILOL 12.5 MG  TABLET] 180 tablet 1    Sig: TAKE 1 TABLET BY MOUTH TWICE A DAY     Cardiovascular: Beta Blockers 3 Passed - 12/28/2021 12:17 PM      Passed - Cr in normal range and within 360 days    Creatinine, Ser  Date Value Ref Range Status  12/05/2021 0.99 0.57 - 1.00 mg/dL Final         Passed - AST in normal range and within 360 days    AST  Date Value Ref Range Status  12/05/2021 16 0 - 40 IU/L Final         Passed - ALT in normal range and within 360 days    ALT  Date Value Ref Range Status  12/05/2021 14 0 - 32 IU/L Final         Passed - Last BP in normal range    BP Readings from Last 1 Encounters:  12/12/21 123/79         Passed - Last Heart Rate in normal range    Pulse Readings from Last 1 Encounters:  12/12/21 74         Passed - Valid encounter within last 6 months    Recent Outpatient Visits           3 weeks ago Annual physical exam   [place] [facility] at [facility], Nyoka Cowden, MD   4 months ago Essential hypertension   [place] [facility] at [facility], Nyoka Cowden, MD   3 years ago Essential hypertension   [place] [facility] and [facility] Medicine at  [facility], Nyoka Cowden, MD       Future Appointments             In 5 months Judithann Graves, Nyoka Cowden, MD [facility] at [facility]   In 11 months Judithann Graves, Nyoka Cowden, MD [facility] Primary Care and Sports Medicine at [facility], [facility]

## 2021-12-28 NOTE — Telephone Encounter (Signed)
Pt is calling to check on the status of medication refill. Please advise CB- [phone]

## 2021-12-28 NOTE — Addendum Note (Signed)
Addended by: Wilford Corner on: 12/28/2021 12:16 PM   Modules accepted: Orders

## 2021-12-28 NOTE — Telephone Encounter (Signed)
[facility] called and spoke to Brittney Morris, Brittney Morris about the refill(s) carvedilol requested. Advised it was sent on 11/26/21 #60/1 refill(s). He says she has 1 refill that they are working on now for a 30 day refill. He says we could send in for future refills because she will not have any after today. Patient called and advised. She verbalized understanding. She says she received a text that she will need to contact her doctor. She says she used to receive a 90 day supply from the old provider and she would like to have a 90 day. She also is requesting a refill on valacycovir. Advised I will submit those requests.

## 2022-01-17 ENCOUNTER — Other Ambulatory Visit: Payer: Medicare PPO

## 2022-01-21 ENCOUNTER — Ambulatory Visit
Admission: RE | Admit: 2022-01-21 | Discharge: 2022-01-21 | Disposition: A | Discharge status: Home / Self Care | Payer: Medicare PPO | Source: Ambulatory Visit | Attending: Internal Medicine | Admitting: Internal Medicine

## 2022-01-21 DIAGNOSIS — M810 Age-related osteoporosis without current pathological fracture: Secondary | ICD-10-CM | POA: Diagnosis present

## 2022-05-02 ENCOUNTER — Telehealth: Payer: Self-pay | Admitting: Internal Medicine

## 2022-05-02 NOTE — Telephone Encounter (Signed)
Contacted Dortha JANNETT LAGLE to schedule their annual wellness visit. Call back at later date: 05/03/2022  Sherol Dade; [facility] Direct Dial: [phone]

## 2022-05-14 ENCOUNTER — Telehealth: Payer: Self-pay | Admitting: Internal Medicine

## 2022-05-14 NOTE — Telephone Encounter (Signed)
Contacted Brittney Morris to schedule their annual wellness visit. Appointment made for 06/20/2022.  Sherol Dade; [facility] Direct Dial: [phone]

## 2022-05-31 ENCOUNTER — Ambulatory Visit
Admission: EM | Admit: 2022-05-31 | Discharge: 2022-05-31 | Disposition: A | Discharge status: Home / Self Care | Payer: Medicare PPO | Attending: Emergency Medicine | Admitting: Emergency Medicine

## 2022-05-31 DIAGNOSIS — J069 Acute upper respiratory infection, unspecified: Secondary | ICD-10-CM | POA: Diagnosis not present

## 2022-05-31 MED ORDER — PROMETHAZINE-DM 6.25-15 MG/5ML PO SYRP: 5.0000 mL | ORAL_SOLUTION | Freq: Four times a day (QID) | ORAL | 0 refills | Status: DC | PRN

## 2022-05-31 MED ORDER — BENZONATATE 100 MG PO CAPS: 100.0000 mg | ORAL_CAPSULE | Freq: Three times a day (TID) | ORAL | 0 refills | Status: DC

## 2022-05-31 NOTE — ED Provider Notes (Signed)
[facility]    CSN: [id number] Arrival date & time: 05/31/22  1233      History   Chief Complaint Chief Complaint  Patient presents with   Cough   Generalized Body Aches    HPI Brittney Morris is a 67 y.o. female.   Patient presents for evaluation of fever, chills, body aches beginning 1 day ago and a nonproductive cough beginning this morning.  Associated general malaise.  Known sick contact but has different symptoms.  Tolerating food and liquids but decreased appetite this morning.  Has attempted use of Robitussin and Advil.  Denies ear pain, sore throat, shortness of breath or wheeze.  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   Presbycusis of both ears 09/11/2017   Allergic rhinitis 08/07/2017   Anxiety disorder 08/07/2017   History of partial hysterectomy 08/07/2017   Mixed hyperlipidemia 08/07/2017   Multiple thyroid nodules 08/07/2017   Essential hypertension 03/08/2010   Age-related osteoporosis without current pathological fracture 03/08/2010    Past Surgical History:  Procedure Laterality Date   ABDOMINAL HYSTERECTOMY     still has cervix and ovaries   CESAREAN SECTION     X5   COLONOSCOPY WITH PROPOFOL N/A 01/25/2019   Procedure: COLONOSCOPY WITH PROPOFOL;  Surgeon: Midge Minium, MD;  Location: [facility];  Service: Endoscopy;  Laterality: N/A;   HAND SURGERY      OB History   No obstetric history on file.      Home Medications    Prior to Admission medications   Medication Sig Start Date End Date Taking? Authorizing Provider  atorvastatin  (LIPITOR) 10 MG tablet Take 1 tablet (10 mg total) by mouth daily. 12/06/21  Yes Reubin Milan, MD  B Complex-C (B-COMPLEX WITH VITAMIN C) tablet Take 1 tablet by mouth daily.   Yes [provider]  Calcium 600-400 MG-UNIT CHEW Chew by mouth. 09/11/17  Yes [provider]  carvedilol (COREG) 12.5 MG tablet Take 1 tablet (12.5 mg total) by mouth 2 (two) times daily. 12/28/21  Yes Reubin Milan, MD  valACYclovir (VALTREX) 500 MG tablet Take 2 tablets (1,000 mg total) by mouth 2 (two) times daily. 12/28/21  Yes Reubin Milan, MD  VITAMIN D, CHOLECALCIFEROL, PO Take by mouth. With K2   Yes [provider]  azithromycin (ZITHROMAX) 500 MG tablet Take 1 tablet (500 mg total) by mouth daily. 12/12/21   Rodriguez-Southworth, Nettie Elm, PA-C    Family History Family History  Problem Relation Age of Onset   Heart disease Mother    Heart attack Mother    Muscular dystrophy Father    Melanoma Daughter     Social History Social History   Tobacco Use   Smoking status: Never   Smokeless tobacco: Never  Vaping Use   Vaping Use: Never used  Substance Use Topics   Alcohol use: Not Currently    Alcohol/week: 10.0 standard drinks of alcohol    Types: 10 Standard drinks or equivalent per week   Drug use: Never     Allergies  Vancomycin, Fluconazole, Sulfa antibiotics, Ciprofloxacin, and Lisinopril   Review of Systems Review of Systems  Constitutional:  Positive for chills and fever. Negative for activity change, appetite change, diaphoresis, fatigue and unexpected weight change.  HENT:  Positive for congestion and rhinorrhea. Negative for dental problem, drooling, ear discharge, ear pain, facial swelling, hearing loss, mouth sores, nosebleeds, postnasal drip, sinus pressure, sinus pain, sneezing, sore throat, tinnitus, trouble swallowing and voice change.   Respiratory:  Positive for cough. Negative for apnea, choking, chest tightness, shortness of breath,  wheezing and stridor.   Cardiovascular: Negative.   Gastrointestinal: Negative.   Musculoskeletal:  Positive for myalgias. Negative for arthralgias, back pain, gait problem, joint swelling, neck pain and neck stiffness.  Skin: Negative.   Neurological:  Positive for headaches. Negative for dizziness, tremors, seizures, syncope, facial asymmetry, speech difficulty, weakness, light-headedness and numbness.     Physical Exam Triage Vital Signs ED Triage Vitals  Enc Vitals Group     BP 05/31/22 1251 123/84     Pulse Rate 05/31/22 1251 85     Resp --      Temp 05/31/22 1251 98.7 F (37.1 C)     Temp Source 05/31/22 1251 Oral     SpO2 05/31/22 1251 96 %     Weight 05/31/22 1245 134 lb (60.8 kg)     Height 05/31/22 1245 5\' 3"  (1.6 m)     Head Circumference --      Peak Flow --      Pain Score 05/31/22 1245 5     Pain Loc --      Pain Edu? --      Excl. in GC? --    No data found.  Updated Vital Signs BP 123/84 (BP Location: Left Arm)   Pulse 85   Temp 98.7 F (37.1 C) (Oral)   Ht 5\' 3"  (1.6 m)   Wt 134 lb (60.8 kg)   SpO2 96%   BMI 23.74 kg/m   Visual Acuity Right Eye Distance:   Left Eye Distance:   Bilateral Distance:    Right Eye Near:   Left Eye Near:    Bilateral Near:     Physical Exam Constitutional:      Appearance: Normal appearance.  HENT:     Head: Normocephalic.     Right Ear: Tympanic membrane, ear canal and external ear normal.     Left Ear: Tympanic membrane, ear canal and external ear normal.     Nose: Nose normal.     Mouth/Throat:     Mouth: Mucous membranes are moist.     Pharynx: Oropharynx is clear.  Eyes:     Extraocular Movements: Extraocular movements intact.  Cardiovascular:     Rate and Rhythm: Normal rate and regular rhythm.     Pulses: Normal pulses.     Heart sounds: Normal heart sounds.  Pulmonary:     Effort: Pulmonary effort is normal.     Breath sounds: Normal breath sounds.  Musculoskeletal:     Cervical back: Normal  range of motion and neck supple.  Skin:    General: Skin is warm and dry.  Neurological:     Mental Status: She is alert and oriented to person, place, and time. Mental status is at baseline.      UC Treatments / Results  Labs (all labs ordered are listed, but only abnormal results are displayed) Labs Reviewed - No data to display  EKG   Radiology No results found.  Procedures  Procedures (including critical care time)  Medications Ordered in UC Medications - No data to display  Initial Impression / Assessment and Plan / UC Course  I have reviewed the triage vital signs and the nursing notes.  Pertinent labs & imaging results that were available during my care of the patient were reviewed by me and considered in my medical decision making (see chart for details).  Viral URI with cough  Patient is in no signs of distress nor toxic appearing.  Vital signs are stable.  Low suspicion for pneumonia, pneumothorax or bronchitis and therefore will defer imaging.  COVID and flu testing.  Prescribed Tessalon and Promethazine DM. May use additional over-the-counter medications as needed for supportive care.  May follow-up with urgent care as needed if symptoms persist or worsen.   Final Clinical Impressions(s) / UC Diagnoses   Final diagnoses:  None   Discharge Instructions   None    ED Prescriptions   None    PDMP not reviewed this encounter.   Valinda Hoar, NP 05/31/22 1349

## 2022-05-31 NOTE — Discharge Instructions (Signed)
Your symptoms today are most likely being caused by a virus and should steadily improve in time it can take up to 7 to 10 days before you truly start to see a turnaround however things will get better  Tessalon pill every 8 hours as needed to help calm your coughing  May use cough syrup every 6 hours as needed for additional comfort, be mindful this may make you feel sleepy    You can take Tylenol and/or Ibuprofen as needed for fever reduction and pain relief.   For cough: honey 1/2 to 1 teaspoon (you can dilute the honey in water or another fluid).  You can also use guaifenesin and dextromethorphan for cough. You can use a humidifier for chest congestion and cough.  If you don't have a humidifier, you can sit in the bathroom with the hot shower running.      For sore throat: try warm salt water gargles, cepacol lozenges, throat spray, warm tea or water with lemon/honey, popsicles or ice, or OTC cold relief medicine for throat discomfort.   For congestion: take a daily anti-histamine like Zyrtec, Claritin, and a oral decongestant, such as pseudoephedrine.  You can also use Flonase 1-2 sprays in each nostril daily.   It is important to stay hydrated: drink plenty of fluids (water, gatorade/powerade/pedialyte, juices, or teas) to keep your throat moisturized and help further relieve irritation/discomfort.

## 2022-05-31 NOTE — ED Triage Notes (Signed)
Pt c/o cough, body chills, temperature of 100.4.  PT has been taking advil for symptoms. Pt last dose was at 10:30am  Pt states that she has had a cough since march.   Pt just returned from the beach.   Pt states that she is not worried about covid or flu.   Pt denies any congestion or chest pain.

## 2022-06-06 ENCOUNTER — Ambulatory Visit (INDEPENDENT_AMBULATORY_CARE_PROVIDER_SITE_OTHER): Payer: Medicare PPO

## 2022-06-06 ENCOUNTER — Encounter: Payer: Self-pay | Admitting: Internal Medicine

## 2022-06-06 ENCOUNTER — Ambulatory Visit (INDEPENDENT_AMBULATORY_CARE_PROVIDER_SITE_OTHER): Payer: Medicare PPO | Admitting: Internal Medicine

## 2022-06-06 VITALS — BP 120/76 | HR 76 | Ht 63.0 in | Wt 136.8 lb

## 2022-06-06 DIAGNOSIS — F5101 Primary insomnia: Secondary | ICD-10-CM | POA: Diagnosis not present

## 2022-06-06 DIAGNOSIS — F39 Unspecified mood [affective] disorder: Secondary | ICD-10-CM | POA: Insufficient documentation

## 2022-06-06 DIAGNOSIS — I10 Essential (primary) hypertension: Secondary | ICD-10-CM | POA: Diagnosis not present

## 2022-06-06 DIAGNOSIS — Z0000 Encounter for general adult medical examination without abnormal findings: Secondary | ICD-10-CM

## 2022-06-06 DIAGNOSIS — E782 Mixed hyperlipidemia: Secondary | ICD-10-CM

## 2022-06-06 NOTE — Assessment & Plan Note (Signed)
Clinically stable exam with well controlled BP on coreg. Tolerating medications without side effects. Pt to continue current regimen and low sodium diet.    

## 2022-06-06 NOTE — Progress Notes (Signed)
Date:  06/06/2022   Name:  Brittney Morris   DOB:  [birth year]   MRN:  [id number]   Chief Complaint: Hyperlipidemia, Hypertension, and Cough (Seen UC 2 weeks ago. Had fever at the time. Was told it was a virus at the time. Did not get tested for flu, covid, or strep. Coughing only when talking. Bringing up mucous only in the mornings-clear. No other sxs. )  Hypertension This is a chronic problem. The problem is controlled. Pertinent negatives include no chest pain, headaches, palpitations or shortness of breath. Past treatments include beta blockers. The current treatment provides significant improvement.  Hyperlipidemia This is a chronic problem. Recent lipid tests were reviewed and are high. Pertinent negatives include no chest pain or shortness of breath. Current antihyperlipidemic treatment includes diet change (atorvastatin ordered but she never took it).  Insomnia Primary symptoms: sleep disturbance, difficulty falling asleep.   The problem occurs nightly. The problem is unchanged. The symptoms are aggravated by alcohol.    Lab Results  Component Value Date   NA 142 12/05/2021   K 4.3 12/05/2021   CO2 23 12/05/2021   GLUCOSE 111 (H) 12/05/2021   BUN 14 12/05/2021   CREATININE 0.99 12/05/2021   CALCIUM 9.6 12/05/2021   EGFR 63 12/05/2021   GFRNONAA 57 (L) 12/21/2018   Lab Results  Component Value Date   CHOL 229 (H) 12/05/2021   HDL 62 12/05/2021   LDLCALC 145 (H) 12/05/2021   TRIG 127 12/05/2021   CHOLHDL 3.7 12/05/2021   Lab Results  Component Value Date   TSH 2.810 12/05/2021   Lab Results  Component Value Date   HGBA1C 5.4 12/05/2021   Lab Results  Component Value Date   WBC 7.0 12/05/2021   HGB 15.6 12/05/2021   HCT 47.3 (H) 12/05/2021   MCV 96 12/05/2021   PLT 257 12/05/2021   Lab Results  Component Value Date   ALT 14 12/05/2021   AST 16 12/05/2021   ALKPHOS 89 12/05/2021   BILITOT 0.4 12/05/2021   No results found for: "25OHVITD2",  "25OHVITD3", "VD25OH"   Review of Systems  Constitutional:  Negative for fatigue and unexpected weight change.  HENT:  Negative for nosebleeds and trouble swallowing.   Eyes:  Negative for visual disturbance.  Respiratory:  Negative for cough, chest tightness, shortness of breath and wheezing.   Cardiovascular:  Negative for chest pain, palpitations and leg swelling.  Gastrointestinal:  Negative for abdominal pain, constipation and diarrhea.  Neurological:  Negative for dizziness, weakness, light-headedness and headaches.  Psychiatric/Behavioral:  Positive for sleep disturbance. Negative for dysphoric mood. The patient has insomnia. The patient is not nervous/anxious.     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   Allergic rhinitis 08/07/2017   Anxiety disorder 08/07/2017   History of partial hysterectomy 08/07/2017   Mixed hyperlipidemia 08/07/2017   Multiple thyroid nodules 08/07/2017   Essential hypertension 03/08/2010   Age-related osteoporosis without current pathological fracture 03/08/2010    Allergies  Allergen Reactions   Vancomycin Itching   Fluconazole Rash   Sulfa Antibiotics Hives   Ciprofloxacin Nausea Only   Lisinopril Cough    Past Surgical History:  Procedure Laterality Date   ABDOMINAL HYSTERECTOMY  still has cervix and ovaries   CESAREAN SECTION     X5   COLONOSCOPY WITH PROPOFOL N/A 01/25/2019   Procedure: COLONOSCOPY WITH PROPOFOL;  Surgeon: Midge Minium, MD;  Location[facility];  Service: Endoscopy;  Laterality: N/A;   HAND SURGERY      Social History   Tobacco Use   Smoking status: Never   Smokeless tobacco: Never  Vaping Use   Vaping Use: Never used  Substance  Use Topics   Alcohol use: Not Currently    Alcohol/week: 10.0 standard drinks of alcohol    Types: 10 Standard drinks or equivalent per week   Drug use: Never     Medication list has been reviewed and updated.  Current Meds  Medication Sig   B Complex-C (B-COMPLEX WITH VITAMIN C) tablet Take 1 tablet by mouth daily.   Calcium 600-400 MG-UNIT CHEW Chew by mouth.   carvedilol (COREG) 12.5 MG tablet Take 1 tablet (12.5 mg total) by mouth 2 (two) times daily.   valACYclovir (VALTREX) 500 MG tablet Take 2 tablets (1,000 mg total) by mouth 2 (two) times daily.   VITAMIN D, CHOLECALCIFEROL, PO Take by mouth. With K2       06/06/2022    8:39 AM 12/05/2021    8:20 AM 08/01/2021    3:00 PM 12/21/2018    2:15 PM  GAD 7 : Generalized Anxiety Score  Nervous, Anxious, on Edge 1 2 0 2  Control/stop worrying 1 2 0 0  Worry too much - different things 1 2 0 0  Trouble relaxing 1 2 0 0  Restless 1 1 0 0  Easily annoyed or irritable 0 1 0 0  Afraid - awful might happen 0 1 0 0  Total GAD 7 Score 5 11 0 2  Anxiety Difficulty Somewhat difficult Somewhat difficult Not difficult at all Not difficult at all       06/06/2022    8:33 AM 12/05/2021    8:20 AM 08/01/2021    2:59 PM  Depression screen PHQ 2/9  Decreased Interest 1 1 1   Down, Depressed, Hopeless 1 1 1   PHQ - 2 Score 2 2 2   Altered sleeping 1 1 1   Tired, decreased energy 1 1 2   Change in appetite 1 1 1   Feeling bad or failure about yourself  1 1 0  Trouble concentrating 0 0 0  Moving slowly or fidgety/restless 0 0 0  Suicidal thoughts 0 0 0  PHQ-9 Score 6 6 6   Difficult doing work/chores Somewhat difficult Somewhat difficult Not difficult at all    BP Readings from Last 3 Encounters:  06/06/22 120/76  05/31/22 123/84  12/12/21 123/79    Physical Exam Vitals and nursing note reviewed.  Constitutional:      General: She is not in acute distress.    Appearance: She is well-developed.  HENT:     Head: Normocephalic and  atraumatic.  Cardiovascular:     Rate and Rhythm: Normal rate and regular rhythm.     Pulses: Normal pulses.  Pulmonary:     Effort: Pulmonary effort is normal. No respiratory distress.     Breath sounds: No rales.  Chest:     Chest wall: No tenderness.  Musculoskeletal:     Cervical back: Normal range of motion.     Right lower leg: No edema.     Left lower leg: No edema.  Lymphadenopathy:     Cervical: No cervical adenopathy.  Skin:  General: Skin is warm and dry.     Capillary Refill: Capillary refill takes less than 2 seconds.     Findings: No rash.  Neurological:     General: No focal deficit present.     Mental Status: She is alert and oriented to person, place, and time.  Psychiatric:        Mood and Affect: Mood normal.        Behavior: Behavior normal.     Wt Readings from Last 3 Encounters:  06/06/22 136 lb 12.8 oz (62.1 kg)  05/31/22 134 lb (60.8 kg)  12/12/21 136 lb (61.7 kg)    BP 120/76   Pulse 76   Ht  (1.6 m)   Wt 136 lb 12.8 oz (62.1 kg)   SpO2 99%   BMI 24.23 kg/m   Assessment and Plan:  Problem List Items Addressed This Visit       Cardiovascular and Mediastinum   Essential hypertension - Primary (Chronic)    Clinically stable exam with well controlled BP on coreg. Tolerating medications without side effects. Pt to continue current regimen and low sodium diet.       Relevant Orders   Comprehensive metabolic panel     Other   Mood disorder    Clinically stable with minimal symptoms, No SI or HI.        Primary insomnia    Avoid alcohol Try Benadryl 12.5 mg at HS      Mixed hyperlipidemia (Chronic)    Statin started 6 mo ago due to elevated CAD risk with HTN but she has not continued it due to concerns about side effects. Working on diet changes first and wants to recheck results. Lab Results  Component Value Date   LDLCALC 145 (H) 12/05/2021        Relevant Orders   Comprehensive metabolic panel   Lipid panel     No follow-ups on file.   MAW completed today by CMA. Partially dictated using Dragon software, any errors are not intentional.  Reubin Milan, MD [facility] [place], [place]

## 2022-06-06 NOTE — Assessment & Plan Note (Addendum)
Statin started 6 mo ago due to elevated CAD risk with HTN but she has not continued it due to concerns about side effects. Working on diet changes first and wants to recheck results. Lab Results  Component Value Date   LDLCALC 145 (H) 12/05/2021

## 2022-06-06 NOTE — Assessment & Plan Note (Addendum)
Clinically stable with minimal symptoms, No SI or HI.

## 2022-06-06 NOTE — Progress Notes (Signed)
Subjective:   Brittney Morris is a 67 y.o. female who presents for Medicare Annual (Subsequent) preventive examination.  I connected with  Brittney Morris on 06/06/22 by an in person visit and verified that I am speaking with the correct person using two identifiers.  Patient Location: Other:  In Office  Provider Location: Office/Clinic  Review of Systems    Defer to PCP  Cardiac Risk Factors include: advanced age (>28men, >92 women);dyslipidemia     Objective:    Today's Vitals   06/06/22 0831  PainSc: 0-No pain   There is no height or weight on file to calculate BMI.     06/06/2022    8:24 AM 05/31/2022   12:51 PM 06/11/2021    3:17 PM 01/25/2019    8:46 AM  Advanced Directives  Does Patient Have a Medical Advance Directive? Yes Yes Yes Yes  Type of Advance Directive Healthcare Power of Attorney Living will;Healthcare Power of [employer] Power of [employer] Power of [place];Living will  Does patient want to make changes to medical advance directive?    No - Patient declined  Copy of Healthcare Power of Attorney in Chart? No - copy requested   No - copy requested    Current Medications (verified) Outpatient Encounter Medications as of 06/06/2022  Medication Sig   atorvastatin (LIPITOR) 10 MG tablet Take 1 tablet (10 mg total) by mouth daily. (Patient not taking: Reported on 06/06/2022)   B Complex-C (B-COMPLEX WITH VITAMIN C) tablet Take 1 tablet by mouth daily.   Calcium 600-400 MG-UNIT CHEW Chew by mouth.   carvedilol (COREG) 12.5 MG tablet Take 1 tablet (12.5 mg total) by mouth 2 (two) times daily.   valACYclovir (VALTREX) 500 MG tablet Take 2 tablets (1,000 mg total) by mouth 2 (two) times daily.   VITAMIN D, CHOLECALCIFEROL, PO Take by mouth. With K2   No facility-administered encounter medications on file as of 06/06/2022.    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   Hyperlipidemia    Hypertension    Insomnia    Past Surgical History:  Procedure Laterality Date   ABDOMINAL HYSTERECTOMY     still has cervix and ovaries   CESAREAN SECTION     X5   COLONOSCOPY WITH PROPOFOL N/A 01/25/2019   Procedure: COLONOSCOPY WITH PROPOFOL;  Surgeon: Midge Minium, MD;  Location: [facility];  Service: Endoscopy;  Laterality: N/A;   HAND SURGERY     Family History  Problem Relation Age of Onset   Heart disease Mother    Heart attack Mother    Muscular dystrophy Father    Melanoma Daughter    Social History   Socioeconomic History   Marital status: Widowed    Spouse name: Not on file   Number of children: 5   Years of education: Not on file   Highest education level: Bachelor's degree (e.g., BA, AB, BS)  Occupational History   Not on file  Tobacco Use   Smoking status: Never   Smokeless tobacco: Never  Vaping Use   Vaping Use: Never used  Substance and Sexual Activity   Alcohol use: Not Currently    Alcohol/week: 10.0 standard drinks of alcohol    Types: 10 Standard drinks or equivalent per week   Drug use: Never  Sexual activity: Not Currently  Other Topics Concern   Not on file  Social History Narrative   Not on file   Social Determinants of Health   Financial Resource Strain: Low Risk  (06/05/2022)   Overall Financial Resource Strain (CARDIA)    Difficulty of Paying Living Expenses: Not hard at all  Food Insecurity: No Food Insecurity (06/05/2022)   Hunger Vital Sign    Worried About Running Out of Food in the Last Year: Never true    Ran Out of Food in the Last Year: Never true  Transportation Needs: No Transportation Needs (06/05/2022)   PRAPARE - Administrator, Civil Service (Medical): No    Lack of Transportation (Non-Medical): No  Physical Activity: Insufficiently Active (06/05/2022)    Exercise Vital Sign    Days of Exercise per Week: 2 days    Minutes of Exercise per Session: 20 min  Stress: Stress Concern Present (06/05/2022)   [employer] of Occupational Health - Occupational Stress Questionnaire    Feeling of Stress : To some extent  Social Connections: Moderately Isolated (06/05/2022)   Social Connection and Isolation Panel [NHANES]    Frequency of Communication with Friends and Family: Three times a week    Frequency of Social Gatherings with Friends and Family: Once a week    Attends Religious Services: 1 to 4 times per year    Active Member of [employer] or Organizations: No    Attends Banker Meetings: Not on file    Marital Status: Widowed    Tobacco Counseling Counseling given: Not Answered   Clinical Intake:  Pre-visit preparation completed: Yes  Pain : No/denies pain Pain Score: 0-No pain     BMI - recorded: 24.23 Nutritional Status: BMI of 19-24  Normal Nutritional Risks: None Diabetes: No  How often do you need to have someone help you when you read instructions, pamphlets, or other written materials from your doctor or pharmacy?: 1 - Never  Diabetic? No.  Interpreter Needed?: No  Information entered by :: Margaretha Sheffield, CMA   Activities of Daily Living    06/06/2022    8:33 AM 06/06/2022    8:25 AM  In your present state of health, do you have any difficulty performing the following activities:  Hearing? 0 0  Vision? 0 0  Difficulty concentrating or making decisions? 0 0  Walking or climbing stairs? 0 0  Dressing or bathing? 0 0  Doing errands, shopping? 0 0  Preparing Food and eating ? N N  Using the Toilet? N N  In the past six months, have you accidently leaked urine? N N  Do you have problems with loss of bowel control? N N  Managing your Medications? N N  Managing your Finances? N N  Housekeeping or managing your Housekeeping? N N    Patient Care Team: Reubin Milan, MD as PCP - General  (Internal Medicine)  Indicate any recent Medical Services you may have received from other than [facility] providers in the past year (date may be approximate).     Assessment:   This is a routine wellness examination for [name].  Hearing/Vision screen Hearing Screening - Comments:: Hearing loss in both ears- supposed to wear hearing aids per patient. Vision Screening - Comments:: No concerns.  Dietary issues and exercise activities discussed: Current Exercise Habits: The patient does not participate in regular exercise at present, Exercise limited by: None identified   Goals Addressed   None   Depression  Screen    06/06/2022    8:33 AM 12/05/2021    8:20 AM 08/01/2021    2:59 PM 12/21/2018    2:13 PM  PHQ 2/9 Scores  PHQ - 2 Score PHQ- 9 Score Fall Risk    06/06/2022    8:32 AM 12/05/2021    8:20 AM 08/01/2021    3:00 PM  Fall Risk   Falls in the past year? 0 0 0  Number falls in past yr: 0 0 0  Injury with Fall? 0 0 0  Risk for fall due to : No Fall Risks No Fall Risks No Fall Risks  Follow up Falls evaluation completed Falls evaluation completed Falls evaluation completed    FALL RISK PREVENTION PERTAINING TO THE HOME:  Any stairs in or around the home? No  If so, are there any without handrails? No  Home free of loose throw rugs in walkways, pet beds, electrical cords, etc? Yes  Adequate lighting in your home to reduce risk of falls? Yes   ASSISTIVE DEVICES UTILIZED TO PREVENT FALLS:  Life alert? No  Use of a cane, walker or w/c? No   TIMED UP AND GO:  Was the test performed? Yes .  Gait steady and fast without use of assistive device   Immunizations Immunization History  Administered Date(s) Administered   Influenza Split 12/20/2010   Moderna Sars-Covid-2 Vaccination 05/07/2019, 06/04/2019   Zoster Recombinat (Shingrix) 12/21/2018, 03/25/2019    TDAP status: Due, Education has been provided regarding the importance of this vaccine.  Advised may receive this vaccine at local pharmacy or Health Dept. Aware to provide a copy of the vaccination record if obtained from local pharmacy or Health Dept. Verbalized acceptance and understanding.  Flu Vaccine status: Declined, Education has been provided regarding the importance of this vaccine but patient still declined. Advised may receive this vaccine at local pharmacy or Health Dept. Aware to provide a copy of the vaccination record if obtained from local pharmacy or Health Dept. Verbalized acceptance and understanding.  Pneumococcal vaccine status: Declined,  Education has been provided regarding the importance of this vaccine but patient still declined. Advised may receive this vaccine at local pharmacy or Health Dept. Aware to provide a copy of the vaccination record if obtained from local pharmacy or Health Dept. Verbalized acceptance and understanding.   Covid-19 vaccine status: Completed vaccines  Qualifies for Shingles Vaccine? Yes   Zostavax completed Yes   Shingrix Completed?: Yes  Screening Tests Health Maintenance  Topic Date Due   DTaP/Tdap/Td (1 - Tdap) Never done   COVID-19 Vaccine (3 - Moderna risk series) 07/02/2019   Pneumonia Vaccine 72+ Years old (1 of 1 - PCV) 12/06/2022 (Originally 06/11/2020)   MAMMOGRAM  08/30/2022   INFLUENZA VACCINE  09/19/2022   Medicare Annual Wellness (AWV)  06/06/2023   COLONOSCOPY (Pts 45-67yrs Insurance coverage will need to be confirmed)  01/24/2029   DEXA SCAN  Completed   Hepatitis C Screening  Completed   Zoster Vaccines- Shingrix  Completed   HPV VACCINES  Aged Out    Health Maintenance  Health Maintenance Due  Topic Date Due   DTaP/Tdap/Td (1 - Tdap) Never done   COVID-19 Vaccine (3 - Moderna risk series) 07/02/2019    Colorectal cancer screening: Type of screening: Colonoscopy. Completed 01/25/2019. Repeat every 10 years  Mammogram status: Completed 08/29/2021. Repeat every year  Bone Density status: Completed  01/21/22. Results reflect:  Bone density results: OSTEOPOROSIS. Repeat every 2-3 years.  Lung Cancer Screening: (Low Dose CT Chest recommended if Age 72-80 years, 30 pack-year currently smoking OR have quit w/in 15years.) does not qualify.   Additional Screening:  Hepatitis C Screening: does qualify; Completed 12/05/21  Vision Screening: Recommended annual ophthalmology exams for early detection of glaucoma and other disorders of the eye. Is the patient up to date with their annual eye exam?  No   Dental Screening: Recommended annual dental exams for proper oral hygiene  Community Resource Referral / Chronic Care Management: CRR required this visit?  No   CCM required this visit?  No      Plan:     I have personally reviewed and noted the following in the patient's chart:   Medical and social history Use of alcohol, tobacco or illicit drugs  Current medications and supplements including opioid prescriptions. Patient is not currently taking opioid prescriptions. Functional ability and status Nutritional status Physical activity Advanced directives List of other physicians Hospitalizations, surgeries, and ER visits in previous 12 months Vitals Screenings to include cognitive, depression, and falls Referrals and appointments  In addition, I have reviewed and discussed with patient certain preventive protocols, quality metrics, and best practice recommendations. A written personalized care plan for preventive services as well as general preventive health recommendations were provided to patient.     Mariel Sleet, CMA   06/06/2022   Nurse Notes: None.

## 2022-06-06 NOTE — Assessment & Plan Note (Signed)
Avoid alcohol Try Benadryl 12.5 mg at HS

## 2022-06-07 LAB — COMPREHENSIVE METABOLIC PANEL
ALT: 16 IU/L (ref 0–32)
AST: 13 IU/L (ref 0–40)
Albumin/Globulin Ratio: 1.7 (ref 1.2–2.2)
Albumin: 4.1 g/dL (ref 3.9–4.9)
Alkaline Phosphatase: 91 IU/L (ref 44–121)
BUN/Creatinine Ratio: 10 — ABNORMAL LOW (ref 12–28)
BUN: 9 mg/dL (ref 8–27)
Bilirubin Total: 0.5 mg/dL (ref 0.0–1.2)
CO2: 23 mmol/L (ref 20–29)
Calcium: 9.4 mg/dL (ref 8.7–10.3)
Chloride: 102 mmol/L (ref 96–106)
Creatinine, Ser: 0.9 mg/dL (ref 0.57–1.00)
Globulin, Total: 2.4 g/dL (ref 1.5–4.5)
Glucose: 111 mg/dL — ABNORMAL HIGH (ref 70–99)
Potassium: 4.3 mmol/L (ref 3.5–5.2)
Sodium: 140 mmol/L (ref 134–144)
Total Protein: 6.5 g/dL (ref 6.0–8.5)
eGFR: 71 mL/min/{1.73_m2} (ref >59)

## 2022-06-07 LAB — LIPID PANEL
Chol/HDL Ratio: 4.7 ratio — ABNORMAL HIGH (ref 0.0–4.4)
Cholesterol, Total: 229 mg/dL — ABNORMAL HIGH (ref 100–199)
HDL: 49 mg/dL (ref >39)
LDL Chol Calc (NIH): 156 mg/dL — ABNORMAL HIGH (ref 0–99)
Triglycerides: 132 mg/dL (ref 0–149)
VLDL Cholesterol Cal: 24 mg/dL (ref 5–40)

## 2022-06-20 ENCOUNTER — Ambulatory Visit: Payer: Medicare PPO

## 2022-07-02 ENCOUNTER — Encounter: Payer: Self-pay | Admitting: Internal Medicine

## 2022-07-02 ENCOUNTER — Ambulatory Visit (INDEPENDENT_AMBULATORY_CARE_PROVIDER_SITE_OTHER): Payer: Medicare PPO | Admitting: Internal Medicine

## 2022-07-02 VITALS — BP 102/64 | HR 76 | Temp 97.9°F | Ht 63.0 in | Wt 136.0 lb

## 2022-07-02 DIAGNOSIS — J029 Acute pharyngitis, unspecified: Secondary | ICD-10-CM

## 2022-07-02 DIAGNOSIS — N761 Subacute and chronic vaginitis: Secondary | ICD-10-CM | POA: Diagnosis not present

## 2022-07-02 LAB — POCT RAPID STREP A (OFFICE): Rapid Strep A Screen: NEGATIVE

## 2022-07-02 MED ORDER — CEFUROXIME AXETIL 500 MG PO TABS: 500.0000 mg | ORAL_TABLET | Freq: Two times a day (BID) | ORAL | 0 refills | Status: AC

## 2022-07-02 MED ORDER — MICONAZOLE NITRATE 100 MG VA SUPP: 100.0000 mg | Freq: Every day | VAGINAL | 0 refills | Status: AC

## 2022-07-02 NOTE — Progress Notes (Signed)
Date:  07/02/2022   Name:  Brittney Morris   DOB:  [birth year]   MRN:  [id number]   Chief Complaint: Sore Throat (Left Ear pain behind ear - started Sunday. )  Sore Throat  This is a new problem. The current episode started in the past 7 days. The problem has been gradually worsening. The pain is mild. Associated symptoms include swollen glands and trouble swallowing. Pertinent negatives include no congestion, coughing, diarrhea or ear pain. She has had exposure to strep.  Vaginal Itching The patient's primary symptoms include genital itching. The patient's pertinent negatives include no vaginal discharge (but vaginal itching). This is a recurrent problem. The problem occurs daily. The problem affects both sides. Associated symptoms include a sore throat. Pertinent negatives include no chills, diarrhea, dysuria, fever, hematuria or urgency.    Lab Results  Component Value Date   NA 140 06/06/2022   K 4.3 06/06/2022   CO2 23 06/06/2022   GLUCOSE 111 (H) 06/06/2022   BUN 9 06/06/2022   CREATININE 0.90 06/06/2022   CALCIUM 9.4 06/06/2022   EGFR 71 06/06/2022   GFRNONAA 57 (L) 12/21/2018   Lab Results  Component Value Date   CHOL 229 (H) 06/06/2022   HDL 49 06/06/2022   LDLCALC 156 (H) 06/06/2022   TRIG 132 06/06/2022   CHOLHDL 4.7 (H) 06/06/2022   Lab Results  Component Value Date   TSH 2.810 12/05/2021   Lab Results  Component Value Date   HGBA1C 5.4 12/05/2021   Lab Results  Component Value Date   WBC 7.0 12/05/2021   HGB 15.6 12/05/2021   HCT 47.3 (H) 12/05/2021   MCV 96 12/05/2021   PLT 257 12/05/2021   Lab Results  Component Value Date   ALT 16 06/06/2022   AST 13 06/06/2022   ALKPHOS 91 06/06/2022   BILITOT 0.5 06/06/2022   No results found for: "25OHVITD2", "25OHVITD3", "VD25OH"   Review of Systems  Constitutional:  Negative for chills, fatigue and fever.  HENT:  Positive for sore throat and trouble swallowing. Negative for congestion and ear pain.    Respiratory:  Negative for cough and chest tightness.   Gastrointestinal:  Negative for diarrhea.  Genitourinary:  Negative for dysuria, genital sores, hematuria, urgency and vaginal discharge (but vaginal itching).  Psychiatric/Behavioral:  Negative for dysphoric mood and sleep disturbance. The patient is not nervous/anxious.     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   Presbycusis of both ears 09/11/2017   Allergic rhinitis 08/07/2017   Anxiety disorder 08/07/2017   History of partial hysterectomy 08/07/2017   Mixed hyperlipidemia 08/07/2017   Multiple thyroid nodules 08/07/2017   Essential hypertension 03/08/2010   Age-related osteoporosis without current pathological fracture 03/08/2010    Allergies  Allergen Reactions   Vancomycin Itching   Fluconazole Rash   Sulfa Antibiotics Hives   Ciprofloxacin Nausea Only   Lisinopril Cough    Past Surgical History:  Procedure Laterality Date   ABDOMINAL HYSTERECTOMY     still has cervix and ovaries   CESAREAN SECTION     X5   COLONOSCOPY WITH PROPOFOL N/A 01/25/2019   Procedure: COLONOSCOPY WITH PROPOFOL;  Surgeon: Midge Minium, MD;  Location: [facility];  Service: Endoscopy;  Laterality: N/A;  HAND SURGERY      Social History   Tobacco Use   Smoking status: Never   Smokeless tobacco: Never  Vaping Use   Vaping Use: Never used  Substance Use Topics   Alcohol use: Not Currently    Alcohol/week: 10.0 standard drinks of alcohol    Types: 10 Standard drinks or equivalent per week   Drug use: Never     Medication list has been reviewed and updated.  Current Meds  Medication Sig   B Complex-C (B-COMPLEX WITH VITAMIN C) tablet Take 1 tablet by mouth daily.   Calcium 600-400  MG-UNIT CHEW Chew by mouth.   carvedilol (COREG) 12.5 MG tablet Take 1 tablet (12.5 mg total) by mouth 2 (two) times daily.   cefUROXime (CEFTIN) 500 MG tablet Take 1 tablet (500 mg total) by mouth 2 (two) times daily with a meal for 7 days.   miconazole (MICOTIN) 100 MG vaginal suppository Place 1 suppository (100 mg total) vaginally at bedtime.   valACYclovir (VALTREX) 500 MG tablet Take 2 tablets (1,000 mg total) by mouth 2 (two) times daily.   VITAMIN D, CHOLECALCIFEROL, PO Take by mouth. With K2   [DISCONTINUED] atorvastatin (LIPITOR) 10 MG tablet Take 1 tablet (10 mg total) by mouth daily.       07/02/2022   11:20 AM 06/06/2022    8:39 AM 12/05/2021    8:20 AM 08/01/2021    3:00 PM  GAD 7 : Generalized Anxiety Score  Nervous, Anxious, on Edge 0 1 2 0  Control/stop worrying 0 1 2 0  Worry too much - different things 0 1 2 0  Trouble relaxing 0 1 2 0  Restless 0 1 1 0  Easily annoyed or irritable 0 0 1 0  Afraid - awful might happen 0 0 1 0  Total GAD 7 Score 0 5 11 0  Anxiety Difficulty Not difficult at all Somewhat difficult Somewhat difficult Not difficult at all       07/02/2022   11:20 AM 06/06/2022    8:33 AM 12/05/2021    8:20 AM  Depression screen PHQ 2/9  Decreased Interest 0 1 1  Down, Depressed, Hopeless 0 1 1  PHQ - 2 Score 0 2 2  Altered sleeping 0 1 1  Tired, decreased energy 0 1 1  Change in appetite 0 1 1  Feeling bad or failure about yourself  0 1 1  Trouble concentrating 0 0 0  Moving slowly or fidgety/restless 0 0 0  Suicidal thoughts 0 0 0  PHQ-9 Score 0 6 6  Difficult doing work/chores Not difficult at all Somewhat difficult Somewhat difficult    BP Readings from Last 3 Encounters:  07/02/22 102/64  06/06/22 120/76  05/31/22 123/84    Physical Exam Vitals and nursing note reviewed.  Constitutional:      General: She is not in acute distress.    Appearance: She is well-developed.  HENT:     Head: Normocephalic and atraumatic.     Right  Ear: Tympanic membrane normal.     Left Ear: Tympanic membrane normal.     Nose:     Right Sinus: No maxillary sinus tenderness.     Left Sinus: No maxillary sinus tenderness.     Mouth/Throat:     Tonsils: No tonsillar exudate or tonsillar abscesses.      Comments: White patches on left tonsil c/w tonsiliths Cardiovascular:     Rate and Rhythm: Normal rate and regular rhythm.  Pulmonary:     Effort: Pulmonary effort is normal. No respiratory distress.     Breath sounds: Normal breath sounds.  Abdominal:     Palpations: Abdomen is soft.     Tenderness: There is no abdominal tenderness. There is no right CVA tenderness or left CVA tenderness.  Skin:    General: Skin is warm and dry.     Findings: No rash.  Neurological:     Mental Status: She is alert and oriented to person, place, and time.  Psychiatric:        Mood and Affect: Mood normal.        Behavior: Behavior normal.     Wt Readings from Last 3 Encounters:  07/02/22 136 lb (61.7 kg)  06/06/22 136 lb 12.8 oz (62.1 kg)  05/31/22 134 lb (60.8 kg)    BP 102/64   Pulse 76   Temp 97.9 F (36.6 C) (Oral)   Ht 5\' 3"  (1.6 m)   Wt 136 lb (61.7 kg)   SpO2 96%   BMI 24.09 kg/m   Assessment and Plan:  Problem List Items Addressed This Visit   None Visit Diagnoses     Sore throat    -  Primary   negative strep continue warm salt water gargles will cover for bacterial infection with Ceftin   Relevant Medications   cefUROXime (CEFTIN) 500 MG tablet   Other Relevant Orders   POCT rapid strep A   Subacute vaginitis       discontinue cortisone vaginally use miconazole vaginal suppositories instead Ceftin will also cover possible cystitis   Relevant Medications   miconazole (MICOTIN) 100 MG vaginal suppository       No follow-ups on file.   Partially dictated using Dragon software, any errors are not intentional.  Reubin Milan, MD [facility] [place], [place]

## 2022-07-23 ENCOUNTER — Other Ambulatory Visit: Payer: Self-pay | Admitting: Internal Medicine

## 2022-08-05 DIAGNOSIS — L578 Other skin changes due to chronic exposure to nonionizing radiation: Secondary | ICD-10-CM | POA: Diagnosis not present

## 2022-08-05 DIAGNOSIS — D2371 Other benign neoplasm of skin of right lower limb, including hip: Secondary | ICD-10-CM | POA: Diagnosis not present

## 2022-08-05 DIAGNOSIS — L02821 Furuncle of head [any part, except face]: Secondary | ICD-10-CM | POA: Diagnosis not present

## 2022-08-05 DIAGNOSIS — L814 Other melanin hyperpigmentation: Secondary | ICD-10-CM | POA: Diagnosis not present

## 2022-08-05 DIAGNOSIS — L821 Other seborrheic keratosis: Secondary | ICD-10-CM | POA: Diagnosis not present

## 2022-09-02 DIAGNOSIS — L218 Other seborrheic dermatitis: Secondary | ICD-10-CM | POA: Diagnosis not present

## 2022-09-17 DIAGNOSIS — H2513 Age-related nuclear cataract, bilateral: Secondary | ICD-10-CM | POA: Diagnosis not present

## 2022-09-17 DIAGNOSIS — H5202 Hypermetropia, left eye: Secondary | ICD-10-CM | POA: Diagnosis not present

## 2022-09-17 DIAGNOSIS — G43101 Migraine with aura, not intractable, with status migrainosus: Secondary | ICD-10-CM | POA: Diagnosis not present

## 2022-09-17 DIAGNOSIS — H02889 Meibomian gland dysfunction of unspecified eye, unspecified eyelid: Secondary | ICD-10-CM | POA: Diagnosis not present

## 2022-09-17 DIAGNOSIS — H52223 Regular astigmatism, bilateral: Secondary | ICD-10-CM | POA: Diagnosis not present

## 2022-10-24 ENCOUNTER — Other Ambulatory Visit: Payer: Self-pay | Admitting: Internal Medicine

## 2022-11-29 DIAGNOSIS — H1012 Acute atopic conjunctivitis, left eye: Secondary | ICD-10-CM | POA: Diagnosis not present

## 2022-11-29 DIAGNOSIS — I10 Essential (primary) hypertension: Secondary | ICD-10-CM | POA: Diagnosis not present

## 2022-12-09 ENCOUNTER — Encounter: Payer: Medicare PPO | Admitting: Internal Medicine

## 2022-12-09 DIAGNOSIS — Z79899 Other long term (current) drug therapy: Secondary | ICD-10-CM | POA: Diagnosis not present

## 2022-12-09 DIAGNOSIS — Z1231 Encounter for screening mammogram for malignant neoplasm of breast: Secondary | ICD-10-CM | POA: Diagnosis not present

## 2022-12-09 DIAGNOSIS — Z0000 Encounter for general adult medical examination without abnormal findings: Secondary | ICD-10-CM | POA: Diagnosis not present

## 2022-12-09 DIAGNOSIS — Z1330 Encounter for screening examination for mental health and behavioral disorders, unspecified: Secondary | ICD-10-CM | POA: Diagnosis not present

## 2022-12-09 DIAGNOSIS — E538 Deficiency of other specified B group vitamins: Secondary | ICD-10-CM | POA: Diagnosis not present

## 2022-12-09 DIAGNOSIS — E782 Mixed hyperlipidemia: Secondary | ICD-10-CM | POA: Diagnosis not present

## 2022-12-09 DIAGNOSIS — E559 Vitamin D deficiency, unspecified: Secondary | ICD-10-CM | POA: Diagnosis not present

## 2022-12-09 DIAGNOSIS — I10 Essential (primary) hypertension: Secondary | ICD-10-CM | POA: Diagnosis not present

## 2022-12-10 DIAGNOSIS — E538 Deficiency of other specified B group vitamins: Secondary | ICD-10-CM | POA: Diagnosis not present

## 2022-12-10 DIAGNOSIS — E559 Vitamin D deficiency, unspecified: Secondary | ICD-10-CM | POA: Diagnosis not present

## 2022-12-10 DIAGNOSIS — Z79899 Other long term (current) drug therapy: Secondary | ICD-10-CM | POA: Diagnosis not present

## 2023-01-01 DIAGNOSIS — Z1231 Encounter for screening mammogram for malignant neoplasm of breast: Secondary | ICD-10-CM | POA: Diagnosis not present

## 2023-11-07 ENCOUNTER — Encounter: Payer: Self-pay | Admitting: Emergency Medicine

## 2023-11-07 ENCOUNTER — Ambulatory Visit
Admission: EM | Admit: 2023-11-07 | Discharge: 2023-11-07 | Disposition: A | Discharge status: Home / Self Care | Attending: Emergency Medicine | Admitting: Emergency Medicine

## 2023-11-07 DIAGNOSIS — I10 Essential (primary) hypertension: Secondary | ICD-10-CM | POA: Insufficient documentation

## 2023-11-07 DIAGNOSIS — R300 Dysuria: Secondary | ICD-10-CM | POA: Diagnosis not present

## 2023-11-07 DIAGNOSIS — R350 Frequency of micturition: Secondary | ICD-10-CM | POA: Diagnosis present

## 2023-11-07 LAB — POCT URINE DIPSTICK
Bilirubin, UA: NEGATIVE
Glucose, UA: NEGATIVE mg/dL
Nitrite, UA: NEGATIVE
Spec Grav, UA: 1.015 (ref 1.010–1.025)
Urobilinogen, UA: 0.2 U/dL
pH, UA: 7 (ref 5.0–8.0)

## 2023-11-07 MED ORDER — CEPHALEXIN 500 MG PO CAPS: 500.0000 mg | ORAL_CAPSULE | Freq: Three times a day (TID) | ORAL | 0 refills | Status: DC

## 2023-11-07 NOTE — ED Triage Notes (Signed)
 Patient in office today complaint of frequent urination since yesterday. With pressure and burning  OTC: oral drink that flush out bacteria  Denies: yeast, discharge

## 2023-11-07 NOTE — ED Provider Notes (Signed)
 [name]    CSN: [id number] Arrival date & time: 11/07/23  1342      History   Chief Complaint Chief Complaint  Patient presents with   Urinary Frequency    HPI Brittney Morris is a 68 y.o. female.  Patient presents with 1 day history of dysuria and urinary frequency.  No fever, abdominal pain, hematuria, flank pain.  Patient has been treating her symptoms with [place].  Her medical history includes hypertension.  The history is provided by the patient and medical records.    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   Presbycusis of both ears 09/11/2017   Allergic rhinitis 08/07/2017   Anxiety disorder 08/07/2017   History of partial hysterectomy 08/07/2017   Mixed hyperlipidemia 08/07/2017   Multiple thyroid nodules 08/07/2017   Essential hypertension 03/08/2010   Age-related osteoporosis without current pathological fracture 03/08/2010    Past Surgical History:  Procedure Laterality Date   ABDOMINAL HYSTERECTOMY     still has cervix and ovaries   CESAREAN SECTION     X5   COLONOSCOPY WITH PROPOFOL  N/A 01/25/2019   Procedure: COLONOSCOPY WITH PROPOFOL ;  Surgeon: Jinny Carmine, MD;  Location: [facility];  Service: Endoscopy;  Laterality: N/A;   HAND SURGERY      OB History   No obstetric history on file.      Home Medications    Prior to Admission medications   Medication Sig Start Date End Date Taking? Authorizing Provider  cephALEXin  (KEFLEX ) 500 MG capsule Take 1 capsule (500 mg total) by mouth 3 (three) times daily for  5 days. 11/07/23 11/12/23 Yes Corlis Burnard DEL, NP  PREMARIN vaginal cream Place vaginally. 08/20/23  Yes [provider]  B Complex-C (B-COMPLEX WITH VITAMIN C) tablet Take 1 tablet by mouth daily.    [provider]  Calcium  600-400 MG-UNIT CHEW Chew by mouth. 09/11/17   [provider]  carvedilol  (COREG ) 12.5 MG tablet TAKE 1 TABLET BY MOUTH TWICE A DAY 10/25/22   Justus Leita DEL, MD  miconazole  (MICOTIN) 100 MG vaginal suppository Place 1 suppository (100 mg total) vaginally at bedtime. 07/02/22   Justus Leita DEL, MD  valACYclovir  (VALTREX ) 500 MG tablet Take 2 tablets (1,000 mg total) by mouth 2 (two) times daily. 12/28/21   Justus Leita DEL, MD  VITAMIN D, CHOLECALCIFEROL, PO Take by mouth. With K2    [provider]    Family History Family History  Problem Relation Age of Onset   Heart disease Mother    Heart attack Mother    Muscular dystrophy Father    Melanoma Daughter     Social History Social History   Tobacco Use   Smoking status: Never   Smokeless tobacco: Never  Vaping Use   Vaping status: Never Used  Substance Use Topics   Alcohol use: Not Currently    Alcohol/week: 10.0 standard drinks of alcohol    Types: 10 Standard drinks or equivalent per week   Drug use: Never  Allergies   Vancomycin, Fluconazole, Sulfa antibiotics, Ciprofloxacin, and Lisinopril   Review of Systems Review of Systems  Constitutional:  Negative for chills and fever.  Gastrointestinal:  Negative for abdominal pain.  Genitourinary:  Positive for dysuria and frequency. Negative for flank pain and hematuria.     Physical Exam Triage Vital Signs ED Triage Vitals  Encounter Vitals Group     BP --      Girls Systolic BP Percentile --      Girls Diastolic BP Percentile --      Boys Systolic BP Percentile --      Boys Diastolic BP Percentile --      Pulse --      Resp --      Temp --      Temp src --      SpO2 --      Weight 11/07/23 1402 129  lb (58.5 kg)     Height 11/07/23 1402 5' 3 (1.6 m)     Head Circumference --      Peak Flow --      Pain Score 11/07/23 1401 5     Pain Loc --      Pain Education --      Exclude from Growth Chart --    No data found.  Updated Vital Signs BP (!) 163/91 (BP Location: Right Arm)   Pulse 78   Temp 97.8 F (36.6 C) (Oral)   Resp 18   Ht 5' 3 (1.6 m)   Wt 129 lb (58.5 kg)   SpO2 98%   BMI 22.85 kg/m   Visual Acuity Right Eye Distance:   Left Eye Distance:   Bilateral Distance:    Right Eye Near:   Left Eye Near:    Bilateral Near:     Physical Exam Constitutional:      General: She is not in acute distress. HENT:     Mouth/Throat:     Mouth: Mucous membranes are moist.  Cardiovascular:     Rate and Rhythm: Normal rate and regular rhythm.  Pulmonary:     Effort: Pulmonary effort is normal. No respiratory distress.  Abdominal:     General: Bowel sounds are normal.     Palpations: Abdomen is soft.     Tenderness: There is no abdominal tenderness. There is no right CVA tenderness, left CVA tenderness, guarding or rebound.  Neurological:     Mental Status: She is alert.      UC Treatments / Results  Labs (all labs ordered are listed, but only abnormal results are displayed) Labs Reviewed  POCT URINE DIPSTICK - Abnormal; Notable for the following components:      Result Value   Clarity, UA cloudy (*)    Ketones, POC UA trace (5) (*)    Blood, UA trace-intact (*)    Protein Ur, POC trace (*)    Leukocytes, UA Large (3+) (*)    All other components within normal limits  URINE CULTURE    EKG   Radiology No results found.  Procedures Procedures (including critical care time)  Medications Ordered in UC Medications - No data to display  Initial Impression / Assessment and Plan / UC Course  I have reviewed the triage vital signs and the nursing notes.  Pertinent labs & imaging results that were available during my care of the patient were reviewed by  me and considered in my medical decision making (see chart for details).    Dysuria, urinary frequency,  elevated blood pressure reading with hypertension.  Treating with Keflex . Urine culture pending. Discussed with patient that we will call her if the urine culture shows the need to change or discontinue the antibiotic. Instructed her to follow-up with her PCP if her symptoms are not improving. Also discussed with patient that her blood pressure is elevated today and needs to be rechecked by PCP in 2 to 4 weeks.  Education provided on managing hypertension.  Patient agrees to plan of care.     Final Clinical Impressions(s) / UC Diagnoses   Final diagnoses:  Urinary frequency  Dysuria  Elevated blood pressure reading in office with diagnosis of hypertension     Discharge Instructions      Take the antibiotic as directed.  The urine culture is pending.  We will call you if it shows the need to change or discontinue your antibiotic.    Your blood pressure is elevated today at 163/91.  Please have this rechecked by your primary care provider.          ED Prescriptions     Medication Sig Dispense Auth. Provider   cephALEXin  (KEFLEX ) 500 MG capsule Take 1 capsule (500 mg total) by mouth 3 (three) times daily for 5 days. 15 capsule Corlis Burnard DEL, NP      PDMP not reviewed this encounter.   Corlis Burnard DEL, NP 11/07/23 1432

## 2023-11-07 NOTE — Discharge Instructions (Addendum)
 Take the antibiotic as directed.  The urine culture is pending.  We will call you if it shows the need to change or discontinue your antibiotic.    Your blood pressure is elevated today at 163/91.  Please have this rechecked by your primary care provider.

## 2023-11-09 LAB — URINE CULTURE: Culture: >=100000 — AB

## 2023-11-10 ENCOUNTER — Ambulatory Visit (HOSPITAL_COMMUNITY): Payer: Self-pay

## 2023-11-10 MED ORDER — CEFIXIME 400 MG PO CAPS: 400.0000 mg | ORAL_CAPSULE | Freq: Every day | ORAL | 0 refills | Status: AC

## 2023-11-10 NOTE — Telephone Encounter (Signed)
 Prescription for Suprax  sent to pharmacy on record.  Discontinue Keflex  at this time.
